# Patient Record
Sex: Female | Born: 1990 | Race: Black or African American | Hispanic: No | Marital: Single | Smoking: Never smoker
Health system: Southern US, Community
[De-identification: ages and names within clinical notes are randomized; demographics above are authoritative.]

---

## 2009-11-29 ENCOUNTER — Emergency Department (HOSPITAL_BASED_OUTPATIENT_CLINIC_OR_DEPARTMENT_OTHER): Admission: EM | Admit: 2009-11-29 | Discharge: 2009-11-29 | Payer: Self-pay | Admitting: Emergency Medicine

## 2010-07-28 ENCOUNTER — Emergency Department (HOSPITAL_BASED_OUTPATIENT_CLINIC_OR_DEPARTMENT_OTHER): Admission: EM | Admit: 2010-07-28 | Discharge: 2010-07-28 | Payer: Self-pay | Admitting: Emergency Medicine

## 2010-11-06 LAB — URINE MICROSCOPIC-ADD ON

## 2010-11-06 LAB — URINALYSIS, ROUTINE W REFLEX MICROSCOPIC
Glucose, UA: NEGATIVE mg/dL
Hgb urine dipstick: NEGATIVE
Protein, ur: NEGATIVE mg/dL
Specific Gravity, Urine: 1.016 (ref 1.005–1.030)
pH: 7.5 (ref 5.0–8.0)

## 2010-11-14 LAB — URINALYSIS, ROUTINE W REFLEX MICROSCOPIC
Bilirubin Urine: NEGATIVE
Hgb urine dipstick: NEGATIVE
Ketones, ur: 15 mg/dL — AB
Nitrite: NEGATIVE
Specific Gravity, Urine: 1.035 — ABNORMAL HIGH (ref 1.005–1.030)
Urobilinogen, UA: 2 mg/dL — ABNORMAL HIGH (ref 0.0–1.0)

## 2010-11-14 LAB — PREGNANCY, URINE: Preg Test, Ur: NEGATIVE

## 2011-01-08 ENCOUNTER — Emergency Department (HOSPITAL_BASED_OUTPATIENT_CLINIC_OR_DEPARTMENT_OTHER)
Admission: EM | Admit: 2011-01-08 | Discharge: 2011-01-08 | Disposition: A | Discharge status: Home / Self Care | Payer: Self-pay | Attending: Emergency Medicine | Admitting: Emergency Medicine

## 2011-01-08 DIAGNOSIS — R300 Dysuria: Secondary | ICD-10-CM | POA: Insufficient documentation

## 2011-01-08 DIAGNOSIS — N390 Urinary tract infection, site not specified: Secondary | ICD-10-CM | POA: Insufficient documentation

## 2011-01-08 LAB — URINALYSIS, ROUTINE W REFLEX MICROSCOPIC
Bilirubin Urine: NEGATIVE
Glucose, UA: NEGATIVE mg/dL
Hgb urine dipstick: NEGATIVE
Specific Gravity, Urine: 1.023 (ref 1.005–1.030)
Urobilinogen, UA: 1 mg/dL (ref 0.0–1.0)

## 2011-01-08 LAB — PREGNANCY, URINE: Preg Test, Ur: NEGATIVE

## 2011-08-25 ENCOUNTER — Emergency Department (HOSPITAL_BASED_OUTPATIENT_CLINIC_OR_DEPARTMENT_OTHER)
Admission: EM | Admit: 2011-08-25 | Discharge: 2011-08-25 | Disposition: A | Discharge status: Home / Self Care | Payer: Medicaid Other | Attending: Emergency Medicine | Admitting: Emergency Medicine

## 2011-08-25 ENCOUNTER — Encounter: Payer: Self-pay | Admitting: Emergency Medicine

## 2011-08-25 DIAGNOSIS — Z331 Pregnant state, incidental: Secondary | ICD-10-CM

## 2011-08-25 DIAGNOSIS — R350 Frequency of micturition: Secondary | ICD-10-CM | POA: Insufficient documentation

## 2011-08-25 DIAGNOSIS — O2690 Pregnancy related conditions, unspecified, unspecified trimester: Secondary | ICD-10-CM | POA: Insufficient documentation

## 2011-08-25 DIAGNOSIS — R109 Unspecified abdominal pain: Secondary | ICD-10-CM | POA: Insufficient documentation

## 2011-08-25 LAB — URINALYSIS, ROUTINE W REFLEX MICROSCOPIC
Leukocytes, UA: NEGATIVE
Nitrite: NEGATIVE
Specific Gravity, Urine: 1.017 (ref 1.005–1.030)
Urobilinogen, UA: 1 mg/dL (ref 0.0–1.0)
pH: 7 (ref 5.0–8.0)

## 2011-08-25 LAB — PREGNANCY, URINE: Preg Test, Ur: POSITIVE

## 2011-08-25 NOTE — ED Provider Notes (Signed)
History     CSN: [id number]  Arrival date & time 08/25/11  0906   First MD Initiated Contact with Patient 08/25/11 [phone]      Chief Complaint  Patient presents with  . Abdominal Pain    (Consider location/radiation/quality/duration/timing/severity/associated sxs/prior treatment) HPI Comments: Pt has noted a "fullness" in her pelvis for several days.  No vaginal pain, bleeding or d/c.  Menses are usually regular.  LMP ~ 07-11-2011.  Performed a home urine pregnancy test which was positive.  Patient is a 20 y.o. female presenting with abdominal pain. The history is provided by the patient. No language interpreter was used.  Abdominal Pain The primary symptoms of the illness do not include abdominal pain, fever, nausea, vomiting, diarrhea, hematochezia, dysuria, vaginal discharge or vaginal bleeding.  The patient states that she believes she is currently not pregnant. The patient has not had a change in bowel habit. Additional symptoms associated with the illness include frequency. Symptoms associated with the illness do not include urgency, hematuria or back pain.    History reviewed. No pertinent past medical history.  History reviewed. No pertinent past surgical history.  History reviewed. No pertinent family history.  History  Substance Use Topics  . Smoking status: Never Smoker   . Smokeless tobacco: Not on file  . Alcohol Use: Yes     occasional    OB History    Grav Para Term Preterm Abortions TAB SAB Ect Mult Living                  Review of Systems  Constitutional: Negative for fever.  Gastrointestinal: Negative for nausea, vomiting, abdominal pain, diarrhea and hematochezia.  Genitourinary: Positive for frequency. Negative for dysuria, urgency, hematuria, vaginal bleeding and vaginal discharge.  Musculoskeletal: Negative for back pain.  All other systems reviewed and are negative.    Allergies  Review of patient's allergies indicates no known  allergies.  Home Medications  No current outpatient prescriptions on file.  BP 125/62  Pulse 102  Temp(Src) 98.4 F (36.9 C) (Oral)  Resp 20  Ht 5\' 4"  (1.626 m)  Wt 248 lb 14.4 oz (112.9 kg)  BMI 42.72 kg/m2  SpO2 100%  LMP 07/11/2011  Physical Exam  Nursing note and vitals reviewed. Constitutional: She is oriented to person, place, and time. She appears well-developed and well-nourished. No distress.  HENT:  Head: Normocephalic and atraumatic.  Eyes: EOM are normal.  Neck: Normal range of motion.  Cardiovascular: Normal rate, regular rhythm and normal heart sounds.   Pulmonary/Chest: Effort normal and breath sounds normal.  Abdominal: Soft. Normal appearance. She exhibits no distension. There is no tenderness. There is no rigidity, no rebound, no guarding, no tenderness at McBurney's point and negative Murphy's sign.    Musculoskeletal: Normal range of motion.  Neurological: She is alert and oriented to person, place, and time.  Skin: Skin is warm and dry.  Psychiatric: She has a normal mood and affect. Judgment normal.    ED Course  Procedures (including critical care time)   Labs Reviewed  PREGNANCY, URINE  URINALYSIS, ROUTINE W REFLEX MICROSCOPIC   No results found.   No diagnosis found.    MDM          Worthy Rancher, PA 08/25/11 1005

## 2011-08-25 NOTE — ED Notes (Signed)
Lower abdominal pain x 2-3 weeks.  No N/V/D.  No vaginal discharge.  No fevers.  Some increase in urinary frequency but no burning or urgency.

## 2011-08-26 NOTE — ED Provider Notes (Signed)
Medical screening examination/treatment/procedure(s) were performed by non-physician practitioner and as supervising physician I was immediately available for consultation/collaboration.   Andree Golphin A. Misha Antonini, MD 08/26/11 1452 

## 2014-04-18 ENCOUNTER — Emergency Department (HOSPITAL_BASED_OUTPATIENT_CLINIC_OR_DEPARTMENT_OTHER)
Admission: EM | Admit: 2014-04-18 | Discharge: 2014-04-18 | Disposition: A | Discharge status: Home / Self Care | Payer: Medicaid Other | Attending: Emergency Medicine | Admitting: Emergency Medicine

## 2014-04-18 ENCOUNTER — Encounter (HOSPITAL_BASED_OUTPATIENT_CLINIC_OR_DEPARTMENT_OTHER): Payer: Self-pay | Admitting: Emergency Medicine

## 2014-04-18 DIAGNOSIS — J358 Other chronic diseases of tonsils and adenoids: Secondary | ICD-10-CM | POA: Diagnosis not present

## 2014-04-18 DIAGNOSIS — J039 Acute tonsillitis, unspecified: Secondary | ICD-10-CM | POA: Diagnosis not present

## 2014-04-18 DIAGNOSIS — J029 Acute pharyngitis, unspecified: Secondary | ICD-10-CM | POA: Diagnosis present

## 2014-04-18 LAB — RAPID STREP SCREEN (MED CTR MEBANE ONLY): Streptococcus, Group A Screen (Direct): NEGATIVE

## 2014-04-18 MED ORDER — PENICILLIN V POTASSIUM 500 MG PO TABS: 500.0000 mg | ORAL_TABLET | Freq: Four times a day (QID) | ORAL | Status: AC

## 2014-04-18 NOTE — Discharge Instructions (Signed)

## 2014-04-18 NOTE — ED Provider Notes (Signed)
History/physical exam/procedure(s) were performed by non-physician practitioner and as supervising physician I was immediately available for consultation/collaboration. I have reviewed all notes and am in agreement with care and plan.   Chalyn Amescua S Akaylah Lalley, MD 04/18/14 2038 

## 2014-04-18 NOTE — ED Notes (Signed)
Sore throat x 3 days

## 2014-04-18 NOTE — ED Provider Notes (Signed)
CSN: [id number]     Arrival date & time 04/18/14  1759 History   First MD Initiated Contact with Patient 04/18/14 1807     Chief Complaint  Patient presents with  . Sore Throat     (Consider location/radiation/quality/duration/timing/severity/associated sxs/prior Treatment) Patient is a 23 y.o. female presenting with pharyngitis. The history is provided by the patient. No language interpreter was used.  Sore Throat This is a new problem. Episode onset: 3 days. The problem occurs constantly. The problem has been gradually worsening. Associated symptoms include a sore throat. Nothing aggravates the symptoms. She has tried nothing for the symptoms. The treatment provided no relief.  Pt complains of a sore throat.  Pt reports multiple hard white areas to throat  History reviewed. No pertinent past medical history. History reviewed. No pertinent past surgical history. No family history on file. History  Substance Use Topics  . Smoking status: Never Smoker   . Smokeless tobacco: Not on file  . Alcohol Use: Yes     Comment: occasional   OB History   Grav Para Term Preterm Abortions TAB SAB Ect Mult Living                 Review of Systems  HENT: Positive for sore throat.   All other systems reviewed and are negative.     Allergies  Review of patient's allergies indicates no known allergies.  Home Medications   Prior to Admission medications   Not on File   BP 130/90  Pulse 111  Temp(Src) 98.1 F (36.7 C) (Oral)  Resp 20  Ht  (1.626 m)  Wt 280 lb (127.007 kg)  BMI 48.04 kg/m2  SpO2 100% Physical Exam  Nursing note and vitals reviewed. Constitutional: She is oriented to person, place, and time. She appears well-developed and well-nourished.  HENT:  Head: Normocephalic.  Mouth/Throat: Oropharyngeal exudate present.  Swollen tonsils,  Exudative areas,  Tonsilliths,   Eyes: EOM are normal.  Neck: Normal range of motion.  Cardiovascular: Normal rate and regular  rhythm.   Pulmonary/Chest: Effort normal.  Abdominal: She exhibits no distension.  Musculoskeletal: Normal range of motion.  Neurological: She is alert and oriented to person, place, and time.  Skin: Skin is warm.  Psychiatric: She has a normal mood and affect.    ED Course  Procedures (including critical care time) Labs Review Labs Reviewed  RAPID STREP SCREEN    Imaging Review No results found.   EKG Interpretation None      MDM   Final diagnoses:  Tonsillith  Tonsillitis    Pt referred to ENT for evaluation of chronic tonsil issues.   I will treat acutely with PCn.      Lonia Skinner [facility], PA-C 04/18/14 [phone]

## 2014-04-18 NOTE — ED Notes (Signed)
PA at bedside.

## 2014-04-20 LAB — CULTURE, GROUP A STREP

## 2014-06-03 ENCOUNTER — Emergency Department (HOSPITAL_BASED_OUTPATIENT_CLINIC_OR_DEPARTMENT_OTHER)
Admission: EM | Admit: 2014-06-03 | Discharge: 2014-06-04 | Disposition: A | Discharge status: Home / Self Care | Payer: Medicaid Other | Attending: Emergency Medicine | Admitting: Emergency Medicine

## 2014-06-03 ENCOUNTER — Encounter (HOSPITAL_BASED_OUTPATIENT_CLINIC_OR_DEPARTMENT_OTHER): Payer: Self-pay | Admitting: Emergency Medicine

## 2014-06-03 DIAGNOSIS — N898 Other specified noninflammatory disorders of vagina: Secondary | ICD-10-CM | POA: Insufficient documentation

## 2014-06-03 DIAGNOSIS — Z3202 Encounter for pregnancy test, result negative: Secondary | ICD-10-CM | POA: Insufficient documentation

## 2014-06-03 DIAGNOSIS — R1031 Right lower quadrant pain: Secondary | ICD-10-CM | POA: Insufficient documentation

## 2014-06-03 DIAGNOSIS — R1032 Left lower quadrant pain: Secondary | ICD-10-CM | POA: Insufficient documentation

## 2014-06-03 DIAGNOSIS — R109 Unspecified abdominal pain: Secondary | ICD-10-CM

## 2014-06-03 LAB — URINALYSIS, ROUTINE W REFLEX MICROSCOPIC
Bilirubin Urine: NEGATIVE
Glucose, UA: NEGATIVE mg/dL
Hgb urine dipstick: NEGATIVE
Ketones, ur: NEGATIVE mg/dL
NITRITE: NEGATIVE
Protein, ur: NEGATIVE mg/dL
SPECIFIC GRAVITY, URINE: 1.006 (ref 1.005–1.030)
UROBILINOGEN UA: 0.2 mg/dL (ref 0.0–1.0)
pH: 6.5 (ref 5.0–8.0)

## 2014-06-03 LAB — PREGNANCY, URINE: PREG TEST UR: NEGATIVE

## 2014-06-03 LAB — URINE MICROSCOPIC-ADD ON

## 2014-06-03 NOTE — ED Notes (Signed)
Abdominal pain since yesterday. Feels like menstrual cramps.

## 2014-06-04 LAB — WET PREP, GENITAL
CLUE CELLS WET PREP: NONE SEEN
TRICH WET PREP: NONE SEEN
YEAST WET PREP: NONE SEEN

## 2014-06-04 MED ORDER — IBUPROFEN 600 MG PO TABS: 600.0000 mg | ORAL_TABLET | Freq: Four times a day (QID) | ORAL | Status: DC | PRN

## 2014-06-04 NOTE — ED Provider Notes (Signed)
CSN: [id number]     Arrival date & time 06/03/14  2311 History   First MD Initiated Contact with Patient 06/04/14 0006     Chief Complaint  Patient presents with  . Abdominal Pain     (Consider location/radiation/quality/duration/timing/severity/associated sxs/prior Treatment) Patient is a 23 y.o. female presenting with abdominal pain. The history is provided by the patient.  Abdominal Pain Pain location:  RLQ and LLQ Pain quality: cramping   Pain radiates to:  Does not radiate Pain severity:  Moderate Duration:  2 days Timing:  Intermittent Progression:  Unchanged Chronicity:  New Ineffective treatments:  None tried Associated symptoms: vaginal discharge   Associated symptoms: no anorexia, no chest pain, no constipation, no diarrhea, no dysuria, no hematuria, no nausea, no shortness of breath, no vaginal bleeding and no vomiting     History reviewed. No pertinent past medical history. History reviewed. No pertinent past surgical history. No family history on file. History  Substance Use Topics  . Smoking status: Never Smoker   . Smokeless tobacco: Not on file  . Alcohol Use: Yes     Comment: occasional   OB History   Grav Para Term Preterm Abortions TAB SAB Ect Mult Living                 Review of Systems  Constitutional: Negative for activity change.  Respiratory: Negative for shortness of breath.   Cardiovascular: Negative for chest pain.  Gastrointestinal: Positive for abdominal pain. Negative for nausea, vomiting, diarrhea, constipation and anorexia.  Genitourinary: Positive for vaginal discharge. Negative for dysuria, hematuria and vaginal bleeding.  Musculoskeletal: Negative for neck pain.  Neurological: Negative for headaches.      Allergies  Review of patient's allergies indicates no known allergies.  Home Medications   Prior to Admission medications   Medication Sig Start Date End Date Taking? Authorizing Provider  ibuprofen (ADVIL,MOTRIN) 600 MG  tablet Take 1 tablet (600 mg total) by mouth every 6 (six) hours as needed. 06/04/14   Finnley [place]so Rhunette CroftNanavati, MD   BP 119/51  Pulse 86  Temp(Src) 98.2 F (36.8 C) (Oral)  Resp 18  Ht 5\' 4"  (1.626 m)  Wt 280 lb (127.007 kg)  BMI 48.04 kg/m2  SpO2 98%  LMP 05/15/2014 Physical Exam  Nursing note and vitals reviewed. Constitutional: She is oriented to person, place, and time. She appears well-developed.  HENT:  Head: Normocephalic and atraumatic.  Eyes: Conjunctivae and EOM are normal. Pupils are equal, round, and reactive to light.  Neck: Normal range of motion. Neck supple.  Cardiovascular: Normal rate, regular rhythm, normal heart sounds and intact distal pulses.   No murmur heard. Pulmonary/Chest: Effort normal. No respiratory distress. She has no wheezes.  Abdominal: Soft. Bowel sounds are normal. She exhibits no distension. There is tenderness. There is no rebound and no guarding.  Lower quadrant tenderness  Genitourinary: Vagina normal and uterus normal.  External exam - normal, no lesions Speculum exam: Pt has some white discharge, no blood Bimanual exam: Patient has no CMT, no adnexal tenderness or fullness and cervical os is closed  Neurological: She is alert and oriented to person, place, and time.  Skin: Skin is warm and dry.    ED Course  Procedures (including critical care time) Labs Review Labs Reviewed  WET PREP, GENITAL - Abnormal; Notable for the following:    WBC, Wet Prep HPF POC FEW (*)    All other components within normal limits  URINALYSIS, ROUTINE W REFLEX MICROSCOPIC - Abnormal; Notable  for the following:    Leukocytes, UA TRACE (*)    All other components within normal limits  URINE MICROSCOPIC-ADD ON - Abnormal; Notable for the following:    Bacteria, UA FEW (*)    All other components within normal limits  GC/CHLAMYDIA PROBE AMP  PREGNANCY, URINE    Imaging Review No results found.   EKG Interpretation None      MDM   Final diagnoses:   Abdominal pain in female    Pt comes in with cc of abd pain. Pain in the lower quadrants, intermittent, crampy. Pt not on period. Upreg neg. Labs are WNL. GI and GU exam non surgical. No indication for CT imaging. don't suspect any emergent condition causing the intermittent, moderate pain in light of normal exam and vitals. Gyne f/u requested.  Derwood KaplanAnkit Miachel Nardelli, MD 06/04/14 [phone]

## 2014-06-04 NOTE — Discharge Instructions (Signed)
Abdominal Pain, Women °Abdominal (stomach, pelvic, or belly) pain can be caused by many things. It is important to tell your doctor: °· The location of the pain. °· Does it come and go or is it present all the time? °· Are there things that start the pain (eating certain foods, exercise)? °· Are there other symptoms associated with the pain (fever, nausea, vomiting, diarrhea)? °All of this is helpful to know when trying to find the cause of the pain. °CAUSES  °· Stomach: virus or bacteria infection, or ulcer. °· Intestine: appendicitis (inflamed appendix), regional ileitis (Crohn's disease), ulcerative colitis (inflamed colon), irritable bowel syndrome, diverticulitis (inflamed diverticulum of the colon), or cancer of the stomach or intestine. °· Gallbladder disease or stones in the gallbladder. °· Kidney disease, kidney stones, or infection. °· Pancreas infection or cancer. °· Fibromyalgia (pain disorder). °· Diseases of the female organs: °¨ Uterus: fibroid (non-cancerous) tumors or infection. °¨ Fallopian tubes: infection or tubal pregnancy. °¨ Ovary: cysts or tumors. °¨ Pelvic adhesions (scar tissue). °¨ Endometriosis (uterus lining tissue growing in the pelvis and on the pelvic organs). °¨ Pelvic congestion syndrome (female organs filling up with blood just before the menstrual period). °¨ Pain with the menstrual period. °¨ Pain with ovulation (producing an egg). °¨ Pain with an IUD (intrauterine device, birth control) in the uterus. °¨ Cancer of the female organs. °· Functional pain (pain not caused by a disease, may improve without treatment). °· Psychological pain. °· Depression. °DIAGNOSIS  °Your doctor will decide the seriousness of your pain by doing an examination. °· Blood tests. °· X-rays. °· Ultrasound. °· CT scan (computed tomography, special type of X-ray). °· MRI (magnetic resonance imaging). °· Cultures, for infection. °· Barium enema (dye inserted in the large intestine, to better view it with  X-rays). °· Colonoscopy (looking in intestine with a lighted tube). °· Laparoscopy (minor surgery, looking in abdomen with a lighted tube). °· Major abdominal exploratory surgery (looking in abdomen with a large incision). °TREATMENT  °The treatment will depend on the cause of the pain.  °· Many cases can be observed and treated at home. °· Over-the-counter medicines recommended by your caregiver. °· Prescription medicine. °· Antibiotics, for infection. °· Birth control pills, for painful periods or for ovulation pain. °· Hormone treatment, for endometriosis. °· Nerve blocking injections. °· Physical therapy. °· Antidepressants. °· Counseling with a psychologist or psychiatrist. °· Minor or major surgery. °HOME CARE INSTRUCTIONS  °· Do not take laxatives, unless directed by your caregiver. °· Take over-the-counter pain medicine only if ordered by your caregiver. Do not take aspirin because it can cause an upset stomach or bleeding. °· Try a clear liquid diet (broth or water) as ordered by your caregiver. Slowly move to a bland diet, as tolerated, if the pain is related to the stomach or intestine. °· Have a thermometer and take your temperature several times a day, and record it. °· Bed rest and sleep, if it helps the pain. °· Avoid sexual intercourse, if it causes pain. °· Avoid stressful situations. °· Keep your follow-up appointments and tests, as your caregiver orders. °· If the pain does not go away with medicine or surgery, you may try: °¨ Acupuncture. °¨ Relaxation exercises (yoga, meditation). °¨ Group therapy. °¨ Counseling. °SEEK MEDICAL CARE IF:  °· You notice certain foods cause stomach pain. °· Your home care treatment is not helping your pain. °· You need stronger pain medicine. °· You want your IUD removed. °· You feel faint or   lightheaded. °· You develop nausea and vomiting. °· You develop a rash. °· You are having side effects or an allergy to your medicine. °SEEK IMMEDIATE MEDICAL CARE IF:  °· Your  pain does not go away or gets worse. °· You have a fever. °· Your pain is felt only in portions of the abdomen. The right side could possibly be appendicitis. The left lower portion of the abdomen could be colitis or diverticulitis. °· You are passing blood in your stools (bright red or black tarry stools, with or without vomiting). °· You have blood in your urine. °· You develop chills, with or without a fever. °· You pass out. °MAKE SURE YOU:  °· Understand these instructions. °· Will watch your condition. °· Will get help right away if you are not doing well or get worse. °Document Released: 06/09/2007 Document Revised: 12/27/2013 Document Reviewed: 06/29/2009 °ExitCare® Patient Information ©2015 [employer]. This information is not intended to replace advice given to you by your health care provider. Make sure you discuss any questions you have with your health care provider. °Pelvic Pain °Pelvic pain is pain felt below the belly button and between your hips. It can be caused by many different things. It is important to get help right away. This is especially true for severe, sharp, or unusual pain that comes on suddenly.  °HOME CARE °· Only take medicine as told by your doctor. °· Rest as told by your doctor. °· Eat a healthy diet, such as fruits, vegetables, and lean meats. °· Drink enough fluids to keep your pee (urine) clear or pale yellow, or as told. °· Avoid sex (intercourse) if it causes pain. °· Apply warm or cold packs to your lower belly (abdomen). Use the type of pack that helps the pain. °· Avoid situations that cause you stress. °· Keep a journal to track your pain. Write down: °¨ When the pain started. °¨ Where it is located. °¨ If there are things that seem to be related to the pain, such as food or your period. °· Follow up with your doctor as told. °GET HELP RIGHT AWAY IF:  °· You have heavy bleeding from the vagina. °· You have more pelvic pain. °· You feel lightheaded or pass out  (faint). °· You have chills. °· You have pain when you pee or have blood in your pee. °· You cannot stop having watery poop (diarrhea). °· You cannot stop throwing up (vomiting). °· You have a fever or lasting symptoms for more than 3 days. °· You have a fever and your symptoms suddenly get worse. °· You are being physically or sexually abused. °· Your medicine does not help your pain. °· You have fluid (discharge) coming from your vagina that is not normal. °MAKE SURE YOU: °· Understand these instructions. °· Will watch your condition. °· Will get help if you are not doing well or get worse. °Document Released: 01/29/2008 Document Revised: 02/11/2012 Document Reviewed: 12/02/2011 °ExitCare® Patient Information ©2015 [employer]. This information is not intended to replace advice given to you by your health care provider. Make sure you discuss any questions you have with your health care provider. ° °

## 2014-06-06 LAB — GC/CHLAMYDIA PROBE AMP
CT Probe RNA: NEGATIVE
GC Probe RNA: NEGATIVE

## 2014-06-07 ENCOUNTER — Telehealth (HOSPITAL_COMMUNITY): Payer: Self-pay

## 2015-05-07 ENCOUNTER — Emergency Department (HOSPITAL_BASED_OUTPATIENT_CLINIC_OR_DEPARTMENT_OTHER)
Admission: EM | Admit: 2015-05-07 | Discharge: 2015-05-07 | Disposition: A | Discharge status: Home / Self Care | Payer: Medicaid Other | Attending: Emergency Medicine | Admitting: Emergency Medicine

## 2015-05-07 ENCOUNTER — Encounter (HOSPITAL_BASED_OUTPATIENT_CLINIC_OR_DEPARTMENT_OTHER): Payer: Self-pay

## 2015-05-07 DIAGNOSIS — O99612 Diseases of the digestive system complicating pregnancy, second trimester: Secondary | ICD-10-CM | POA: Insufficient documentation

## 2015-05-07 DIAGNOSIS — Z3A26 26 weeks gestation of pregnancy: Secondary | ICD-10-CM | POA: Insufficient documentation

## 2015-05-07 DIAGNOSIS — R197 Diarrhea, unspecified: Secondary | ICD-10-CM | POA: Diagnosis not present

## 2015-05-07 DIAGNOSIS — K115 Sialolithiasis: Secondary | ICD-10-CM

## 2015-05-07 DIAGNOSIS — O9989 Other specified diseases and conditions complicating pregnancy, childbirth and the puerperium: Secondary | ICD-10-CM | POA: Diagnosis present

## 2015-05-07 MED ORDER — HYDROCODONE-ACETAMINOPHEN 5-325 MG PO TABS: 1.0000 | ORAL_TABLET | Freq: Four times a day (QID) | ORAL | Status: DC | PRN

## 2015-05-07 MED ORDER — CEPHALEXIN 500 MG PO CAPS: 500.0000 mg | ORAL_CAPSULE | Freq: Four times a day (QID) | ORAL | Status: DC

## 2015-05-07 MED ORDER — CEPHALEXIN 250 MG PO CAPS
500.0000 mg | ORAL_CAPSULE | Freq: Once | ORAL | Status: AC
  Administered 2015-05-07: 500 mg via ORAL
  Filled 2015-05-07: qty 2

## 2015-05-07 NOTE — ED Notes (Addendum)
Swelling noted to left jaw that started today.  26 weeks preg.  Denies difficulty breathing or swallowing.

## 2015-05-07 NOTE — ED Notes (Addendum)
Dr. Deretha Emory at [facility]. Pt alert, NAD, calm, interactive, c/o L neck swelling and tenderness (noted), mentions occaisional heartburn,  (denies: dysphagia, dysarthria, cough, congestion, sob, ear issues, nvd, fever, sob, dizziness), pt [redacted] wks pregnant, last OB visit 4 weeks ago, pt of [facility] Dr. Allena Katz. orophaynx unremarkable. States, "feeling baby move all the time".

## 2015-05-07 NOTE — ED Provider Notes (Signed)
CSN: [id number]     Arrival date & time 05/07/15  1817 History  This chart was scribed for Vanetta Mulders, MD by Budd Palmer, ED Scribe. This patient was seen in room MH05/MH05 and the patient's care was started at 7:38 PM.     Chief Complaint  Patient presents with  . Lymphadenopathy   The history is provided by the patient. No language interpreter was used.   HPI Comments: Carrie Koch is a 24 y.o. female who is [redacted] weeks pregnant, who presents to the Emergency Department complaining of lymphadenopathy onset 1 PM today. She states the swelling began under her tongue, and then spread to under the left jaw. She reports associated pain to the area, and notes it feels as though she has a "swollen vein" under her tongue. She states she is seeing an OBGYN in [place] and is on prenatal vitamins. She will be following up with them in 5 days. She notes she has felt fetal movement on several occasions today. Pt denies fever or dental pain.  History reviewed. No pertinent past medical history. History reviewed. No pertinent past surgical history. No family history on file. Social History  Substance Use Topics  . Smoking status: Never Smoker   . Smokeless tobacco: None  . Alcohol Use: No     Comment: occasional   OB History    Gravida Para Term Preterm AB TAB SAB Ectopic Multiple Living   1              Review of Systems  Constitutional: Negative for fever.  HENT: Negative for dental problem, rhinorrhea, sore throat and trouble swallowing.   Eyes: Negative for visual disturbance.  Respiratory: Negative for cough and shortness of breath.   Cardiovascular: Negative for chest pain and leg swelling.  Gastrointestinal: Positive for diarrhea. Negative for nausea, vomiting and abdominal pain.  Genitourinary: Negative for dysuria and vaginal bleeding.  Skin: Negative for rash.  Neurological: Negative for headaches.  Hematological: Does not bruise/bleed easily.    Allergies  Review of  patient's allergies indicates no known allergies.  Home Medications   Prior to Admission medications   Medication Sig Start Date End Date Taking? Authorizing Provider  Prenatal Vit-Fe Fumarate-FA (PRENATAL MULTIVITAMIN) TABS tablet Take 1 tablet by mouth daily at 12 noon.   Yes Historical Provider, MD  cephALEXin (KEFLEX) 500 MG capsule Take 1 capsule (500 mg total) by mouth 4 (four) times daily. 05/07/15   Vanetta Mulders, MD  HYDROcodone-acetaminophen (NORCO/VICODIN) 5-325 MG per tablet Take 1-2 tablets by mouth every 6 (six) hours as needed for moderate pain. 05/07/15   Vanetta Mulders, MD  ibuprofen (ADVIL,MOTRIN) 600 MG tablet Take 1 tablet (600 mg total) by mouth every 6 (six) hours as needed. 06/04/14   Ankit Rhunette Croft, MD   BP 114/69 mmHg  Pulse 92  Temp(Src) 98.4 F (36.9 C) (Oral)  Resp 18  SpO2 99%  LMP 05/15/2014 Physical Exam  Constitutional: She is oriented to person, place, and time. She appears well-developed and well-nourished.  HENT:  Head: Normocephalic and atraumatic.  Mouth/Throat: Oropharynx is clear and moist.  Uvula is midline. Submandibular gland is significantly swollen at 3 cm in diameter, and fairly hard without erythema.  Eyes: Conjunctivae and EOM are normal. Pupils are equal, round, and reactive to light. Right eye exhibits no discharge. Left eye exhibits no discharge. No scleral icterus.  Cardiovascular: Normal rate, regular rhythm and normal heart sounds.   Pulmonary/Chest: Effort normal and breath sounds normal. No respiratory  distress.  Abdominal: Bowel sounds are normal. There is no tenderness.  Musculoskeletal: She exhibits no edema.  No ankle swelling  Neurological: She is alert and oriented to person, place, and time. No cranial nerve deficit. She exhibits normal muscle tone. Coordination normal.  Skin: Skin is warm and dry. No rash noted. She is not diaphoretic. No erythema.  Psychiatric: She has a normal mood and affect.  Nursing note and vitals  reviewed.   ED Course  Procedures  DIAGNOSTIC STUDIES: Oxygen Saturation is 98% on RA, normal by my interpretation.    COORDINATION OF CARE: 7:47 PM - Discussed probable stone in the salivary duct. Discussed plans to stimulate salivary gland to pass the stone. Pt advised of plan for treatment and pt agrees.  Labs Review Labs Reviewed - No data to display  Imaging Review No results found. I have personally reviewed and evaluated these images and lab results as part of my medical decision-making.   EKG Interpretation None      MDM   Final diagnoses:  Sialolithiasis of submandibular gland    Acute swelling of the left submandibular salivary gland. Most likely due to a stone. Not able to palpate a stone. A whitish discharge but not consistent with pus. Will treat which is hydrocodone and not anti-inflammatory since she's [redacted] weeks pregnant. Also will treat with the antibody Keflex to prevent any secondary infection. Referral to ear nose and throat provided. Patient has follow-up with OB/GYN later this week. Patient without any complicating factors related to her pregnancy at this time.   I personally performed the services described in this documentation, which was scribed in my presence. The recorded information has been reviewed and is accurate.    Vanetta Mulders, MD 05/07/15 2059

## 2015-05-07 NOTE — Discharge Instructions (Signed)
Salivary Stone Your exam shows you have a stone in one of your saliva glands. These small stones form around a mucous plug in the ducts of the glands and cause the saliva in the gland to be blocked. This makes the gland swollen and painful, especially when you eat. If repeated episodes occur, the gland can become infected. Sometimes these stones can be seen on x-ray. Treatment includes stimulating the production of saliva to push the stone out. You should suck on a lemon or sour candies several times daily. Antibiotic medicine may be needed if the gland is infected. Increasing fluids, applying warm compresses to the swollen area 3-4 times daily, and massaging the gland from back to front may encourage drainage and passage of the stone. Surgical treatment to remove the stone is sometimes necessary, so proper medical follow up is very important. Call your doctor for an appointment as recommended. Call right away if you have a high fever, severe headache, vomiting, uncontrolled pain, or other serious symptoms. Document Released: 09/19/2004 Document Revised: 11/04/2011 Document Reviewed: 08/12/2005 [facility] Patient Information 2015 [place], [place]. This information is not intended to replace advice given to you by your health care provider. Make sure you discuss any questions you have with your health care provider.  Continue use the antibody as directed. Take the pain medicine as needed. Continue to massage the gland but do not squeeze it. Continue to use of sour tasting lemon drops or either lemon to help stimulate the gland to get the stone out.

## 2015-11-09 ENCOUNTER — Emergency Department (HOSPITAL_BASED_OUTPATIENT_CLINIC_OR_DEPARTMENT_OTHER)
Admission: EM | Admit: 2015-11-09 | Discharge: 2015-11-09 | Disposition: A | Discharge status: Home / Self Care | Payer: Medicaid Other | Attending: Physician Assistant | Admitting: Physician Assistant

## 2015-11-09 ENCOUNTER — Emergency Department (HOSPITAL_BASED_OUTPATIENT_CLINIC_OR_DEPARTMENT_OTHER): Payer: Medicaid Other

## 2015-11-09 ENCOUNTER — Encounter (HOSPITAL_BASED_OUTPATIENT_CLINIC_OR_DEPARTMENT_OTHER): Payer: Self-pay | Admitting: *Deleted

## 2015-11-09 DIAGNOSIS — B349 Viral infection, unspecified: Secondary | ICD-10-CM

## 2015-11-09 DIAGNOSIS — R000 Tachycardia, unspecified: Secondary | ICD-10-CM | POA: Diagnosis not present

## 2015-11-09 DIAGNOSIS — J029 Acute pharyngitis, unspecified: Secondary | ICD-10-CM | POA: Diagnosis present

## 2015-11-09 DIAGNOSIS — Z79899 Other long term (current) drug therapy: Secondary | ICD-10-CM | POA: Insufficient documentation

## 2015-11-09 LAB — RAPID STREP SCREEN (MED CTR MEBANE ONLY): Streptococcus, Group A Screen (Direct): NEGATIVE

## 2015-11-09 MED ORDER — GUAIFENESIN 100 MG/5ML PO LIQD: 100.0000 mg | ORAL | Status: DC | PRN

## 2015-11-09 MED ORDER — IBUPROFEN 800 MG PO TABS: 800.0000 mg | ORAL_TABLET | Freq: Three times a day (TID) | ORAL | Status: DC

## 2015-11-09 MED ORDER — ACETAMINOPHEN 325 MG PO TABS
650.0000 mg | ORAL_TABLET | Freq: Once | ORAL | Status: AC
  Administered 2015-11-09: 650 mg via ORAL
  Filled 2015-11-09: qty 2

## 2015-11-09 MED FILL — ROBAFEN 100 MG/5 ML SYRUP: 100 | 2 days supply | Qty: 118 | Fill #0

## 2015-11-09 MED FILL — IBUPROFEN 800 MG TABLET: 800 | 7 days supply | Qty: 21 | Fill #0

## 2015-11-09 NOTE — ED Provider Notes (Signed)
CSN: [id number]     Arrival date & time 11/09/15  1013 History   First MD Initiated Contact with Patient 11/09/15 1023     Chief Complaint  Patient presents with  . Sore Throat     (Consider location/radiation/quality/duration/timing/severity/associated sxs/prior Treatment) HPI   Patient a very pleasant 25 year old female percent in a couple days of fever, myalgia, sore throat, cough. Patient's had no nausea vomiting or diarrhea. Patient has a 10-year-old home as well as a 58-month-old. Patient is currently nursing.  Patient has noted that the symptoms have been getting worse over the last 2 days.   Patient has cough. She has been taking over-the-counter remedies which have been helping.  History reviewed. No pertinent past medical history. History reviewed. No pertinent past surgical history. History reviewed. No pertinent family history. Social History  Substance Use Topics  . Smoking status: Never Smoker   . Smokeless tobacco: None  . Alcohol Use: No     Comment: occasional   OB History    Gravida Para Term Preterm AB TAB SAB Ectopic Multiple Living   1              Review of Systems  Constitutional: Positive for fever and fatigue. Negative for activity change.  HENT: Positive for congestion.   Respiratory: Positive for cough. Negative for shortness of breath.   Cardiovascular: Negative for chest pain.  Gastrointestinal: Negative for abdominal pain.  Musculoskeletal: Negative for back pain.  Neurological: Positive for headaches. Negative for weakness.      Allergies  Review of patient's allergies indicates no known allergies.  Home Medications   Prior to Admission medications   Medication Sig Start Date End Date Taking? Authorizing Provider  guaiFENesin (ROBITUSSIN) 100 MG/5ML liquid Take 5-10 mLs (100-200 mg total) by mouth every 4 (four) hours as needed for cough. 11/09/15   Alando Colleran Lyn Miho Monda, MD  ibuprofen (ADVIL,MOTRIN) 800 MG tablet Take 1 tablet (800 mg  total) by mouth 3 (three) times daily. 11/09/15   Racquelle Hyser Lyn Mancil Pfenning, MD  Prenatal Vit-Fe Fumarate-FA (PRENATAL MULTIVITAMIN) TABS tablet Take 1 tablet by mouth daily at 12 noon.    Historical Provider, MD   BP 122/68 mmHg  Pulse 88  Temp(Src) 98.7 F (37.1 C) (Oral)  Resp 22  Ht  (1.626 m)  Wt 275 lb (124.739 kg)  BMI 47.18 kg/m2  SpO2 100%  Breastfeeding? Yes Physical Exam  Constitutional: She is oriented to person, place, and time. She appears well-developed and well-nourished.  HENT:  Head: Normocephalic and atraumatic.  Mild erythema to posterior pharynx.  Eyes: Conjunctivae are normal. Right eye exhibits no discharge.  Neck: Neck supple.  Cardiovascular: Regular rhythm and normal heart sounds.   No murmur heard. Tachycardia  Pulmonary/Chest: Effort normal and breath sounds normal. She has no wheezes. She has no rales.  Abdominal: Soft. She exhibits no distension. There is no tenderness.  Musculoskeletal: Normal range of motion. She exhibits no edema.  Neurological: She is oriented to person, place, and time. No cranial nerve deficit.  Skin: Skin is warm and dry. No rash noted. She is not diaphoretic.  Psychiatric: She has a normal mood and affect. Her behavior is normal.  Nursing note and vitals reviewed.   ED Course  Procedures (including critical care time) Labs Review Labs Reviewed  RAPID STREP SCREEN (NOT AT [facility])  CULTURE, GROUP A STREP [facility])    Imaging Review Dg Chest 2 View  11/09/2015  CLINICAL DATA:  Cough and fever for 4  days EXAM: CHEST  2 VIEW COMPARISON:  None. FINDINGS: The heart size and mediastinal contours are within normal limits. Both lungs are clear. The visualized skeletal structures are unremarkable. IMPRESSION: No active cardiopulmonary disease. Electronically Signed   By: Alcide CleverMark  Lukens M.D.   On: 11/09/2015 11:09   I have personally reviewed and evaluated these images and lab results as part of my medical decision-making.   EKG  Interpretation None      MDM   Final diagnoses:  Viral syndrome    Patient's very pleasant 25 year old female presenting with a couple days of viral-like illness. Patient reports she's been controlling her symptoms at home with over-the-counter medications. Will order strep test as well as chest x-ray. Anticipate that they will be negative we'll treat as viral illness. Patient lactating so we'll be conservative on the medications that we give to make sure the are safe for breast milk. Robitussin  seems to have a good safety profile and thus we'll prescribe this.  Strep neg, cxr normal. Will discharge with robitussin, ibuprofen and follow up with PCP.  Patient had normal vitals, and appeared nontoxic at time of discharg.    Magdalyn Arenivas Randall AnLyn Travonte Byard, MD 11/09/15 1353

## 2015-11-09 NOTE — Discharge Instructions (Signed)
Be sure to drink plenty of fluids. Use that occasion provided to help with cough. It is reportedly safe in breast-feeding. Follow up with your primary care physician.   Viral Infections A viral infection can be caused by different types of viruses.Most viral infections are not serious and resolve on their own. However, some infections may cause severe symptoms and may lead to further complications. SYMPTOMS Viruses can frequently cause:  Minor sore throat.  Aches and pains.  Headaches.  Runny nose.  Different types of rashes.  Watery eyes.  Tiredness.  Cough.  Loss of appetite.  Gastrointestinal infections, resulting in nausea, vomiting, and diarrhea. These symptoms do not respond to antibiotics because the infection is not caused by bacteria. However, you might catch a bacterial infection following the viral infection. This is sometimes called a "superinfection." Symptoms of such a bacterial infection may include:  Worsening sore throat with pus and difficulty swallowing.  Swollen neck glands.  Chills and a high or persistent fever.  Severe headache.  Tenderness over the sinuses.  Persistent overall ill feeling (malaise), muscle aches, and tiredness (fatigue).  Persistent cough.  Yellow, green, or brown mucus production with coughing. HOME CARE INSTRUCTIONS   Only take over-the-counter or prescription medicines for pain, discomfort, diarrhea, or fever as directed by your caregiver.  Drink enough water and fluids to keep your urine clear or pale yellow. Sports drinks can provide valuable electrolytes, sugars, and hydration.  Get plenty of rest and maintain proper nutrition. Soups and broths with crackers or rice are fine. SEEK IMMEDIATE MEDICAL CARE IF:   You have severe headaches, shortness of breath, chest pain, neck pain, or an unusual rash.  You have uncontrolled vomiting, diarrhea, or you are unable to keep down fluids.  You or your child has an oral  temperature above 102 F (38.9 C), not controlled by medicine.  Your baby is older than 3 months with a rectal temperature of 102 F (38.9 C) or higher.  Your baby is 673 months old or younger with a rectal temperature of 100.4 F (38 C) or higher. MAKE SURE YOU:   Understand these instructions.  Will watch your condition.  Will get help right away if you are not doing well or get worse.   This information is not intended to replace advice given to you by your health care provider. Make sure you discuss any questions you have with your health care provider.   Document Released: 05/22/2005 Document Revised: 11/04/2011 Document Reviewed: 01/18/2015 Elsevier Interactive Patient Education [employer]2016 Elsevier Inc.

## 2015-11-09 NOTE — ED Notes (Signed)
Pt amb to room 12 with quick steady gait in nad, pt reports sore throat and subjective temps x 3 days, with cough producing yellow sputum.

## 2015-11-12 LAB — CULTURE, GROUP A STREP (THRC)

## 2017-03-20 IMAGING — CR DG CHEST 2V
2 series · 2 of 2 positions shown · non-contrast
Comparison: None.

CLINICAL DATA: Cough and fever for 4 days

EXAM:
CHEST  2 VIEW

[w chest pa]
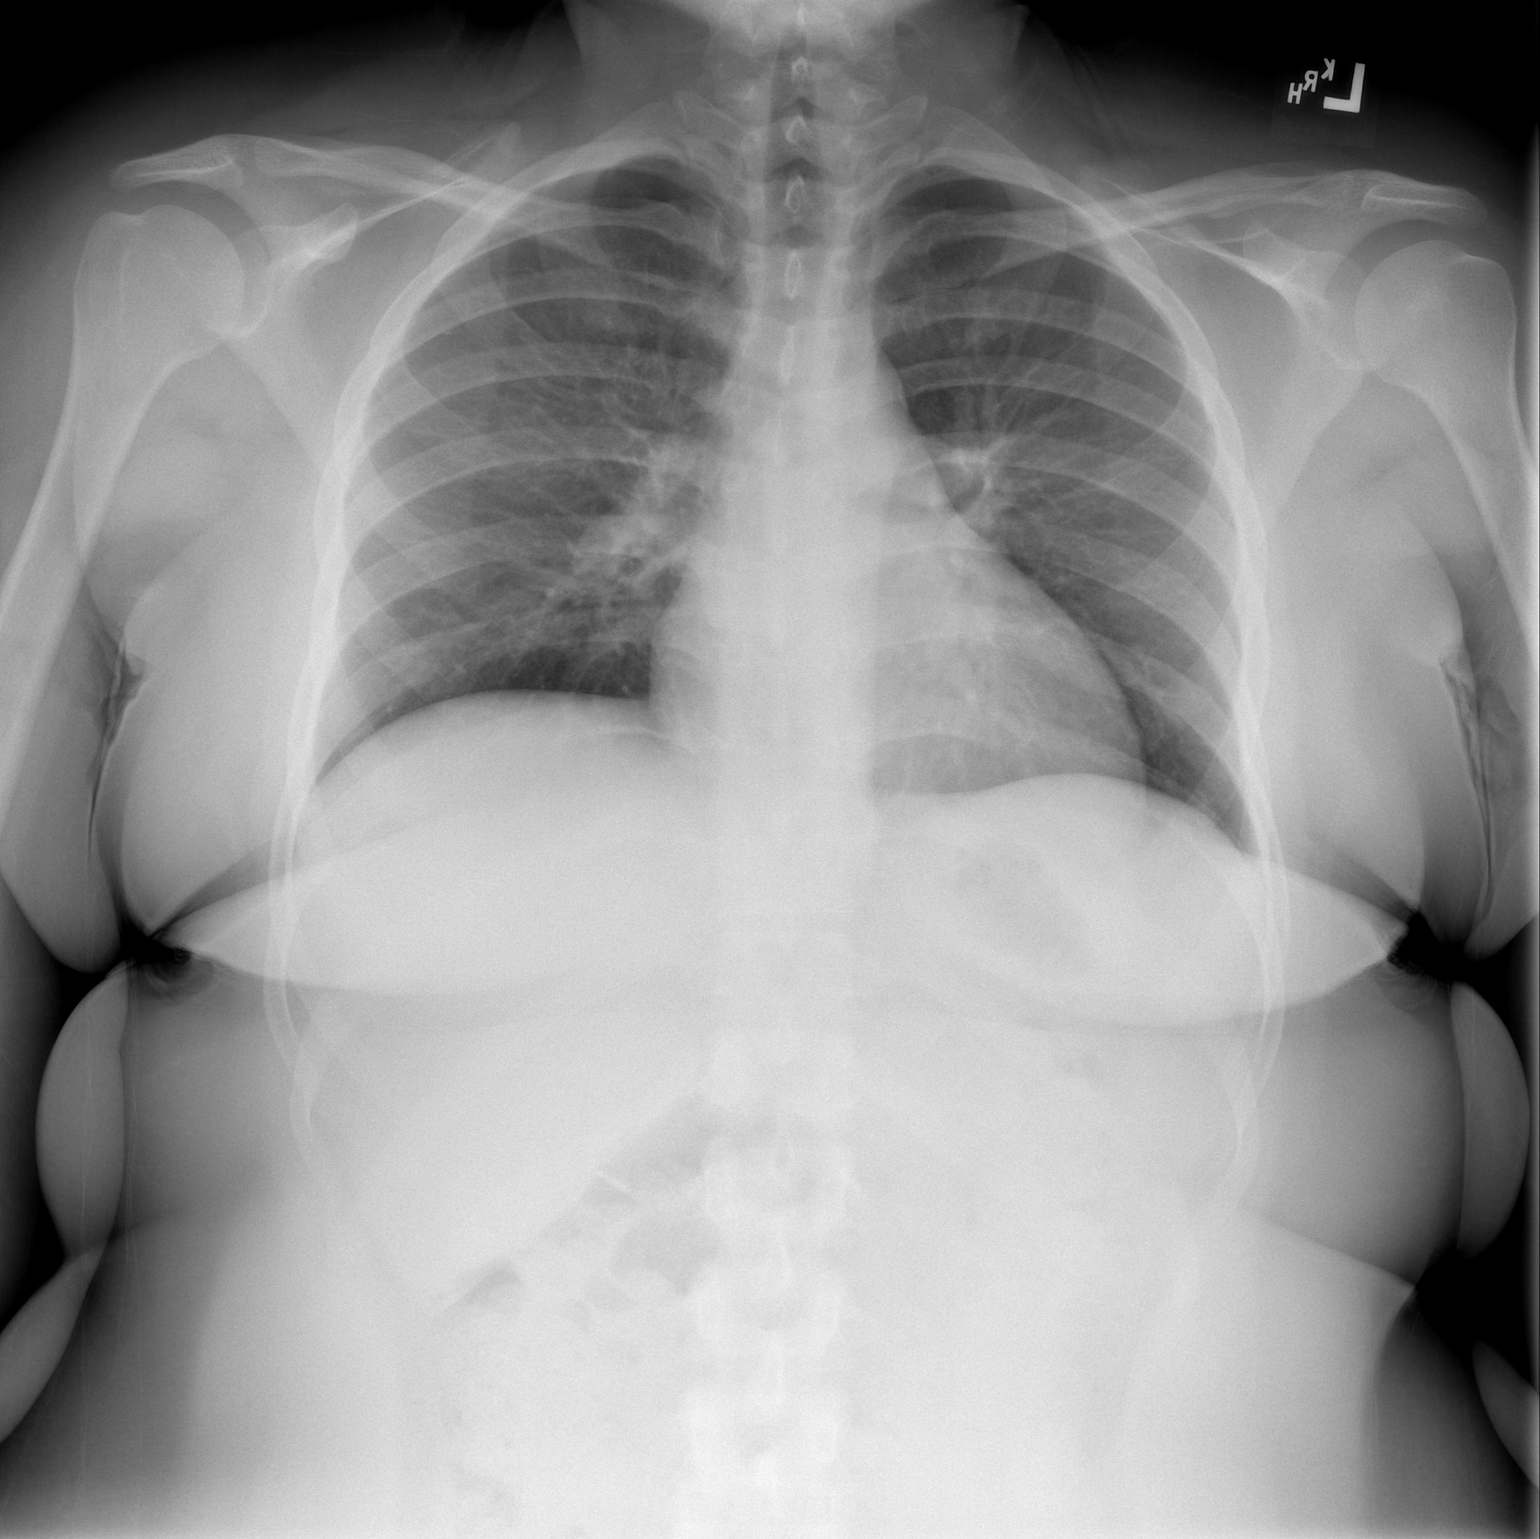

[w chest lat]
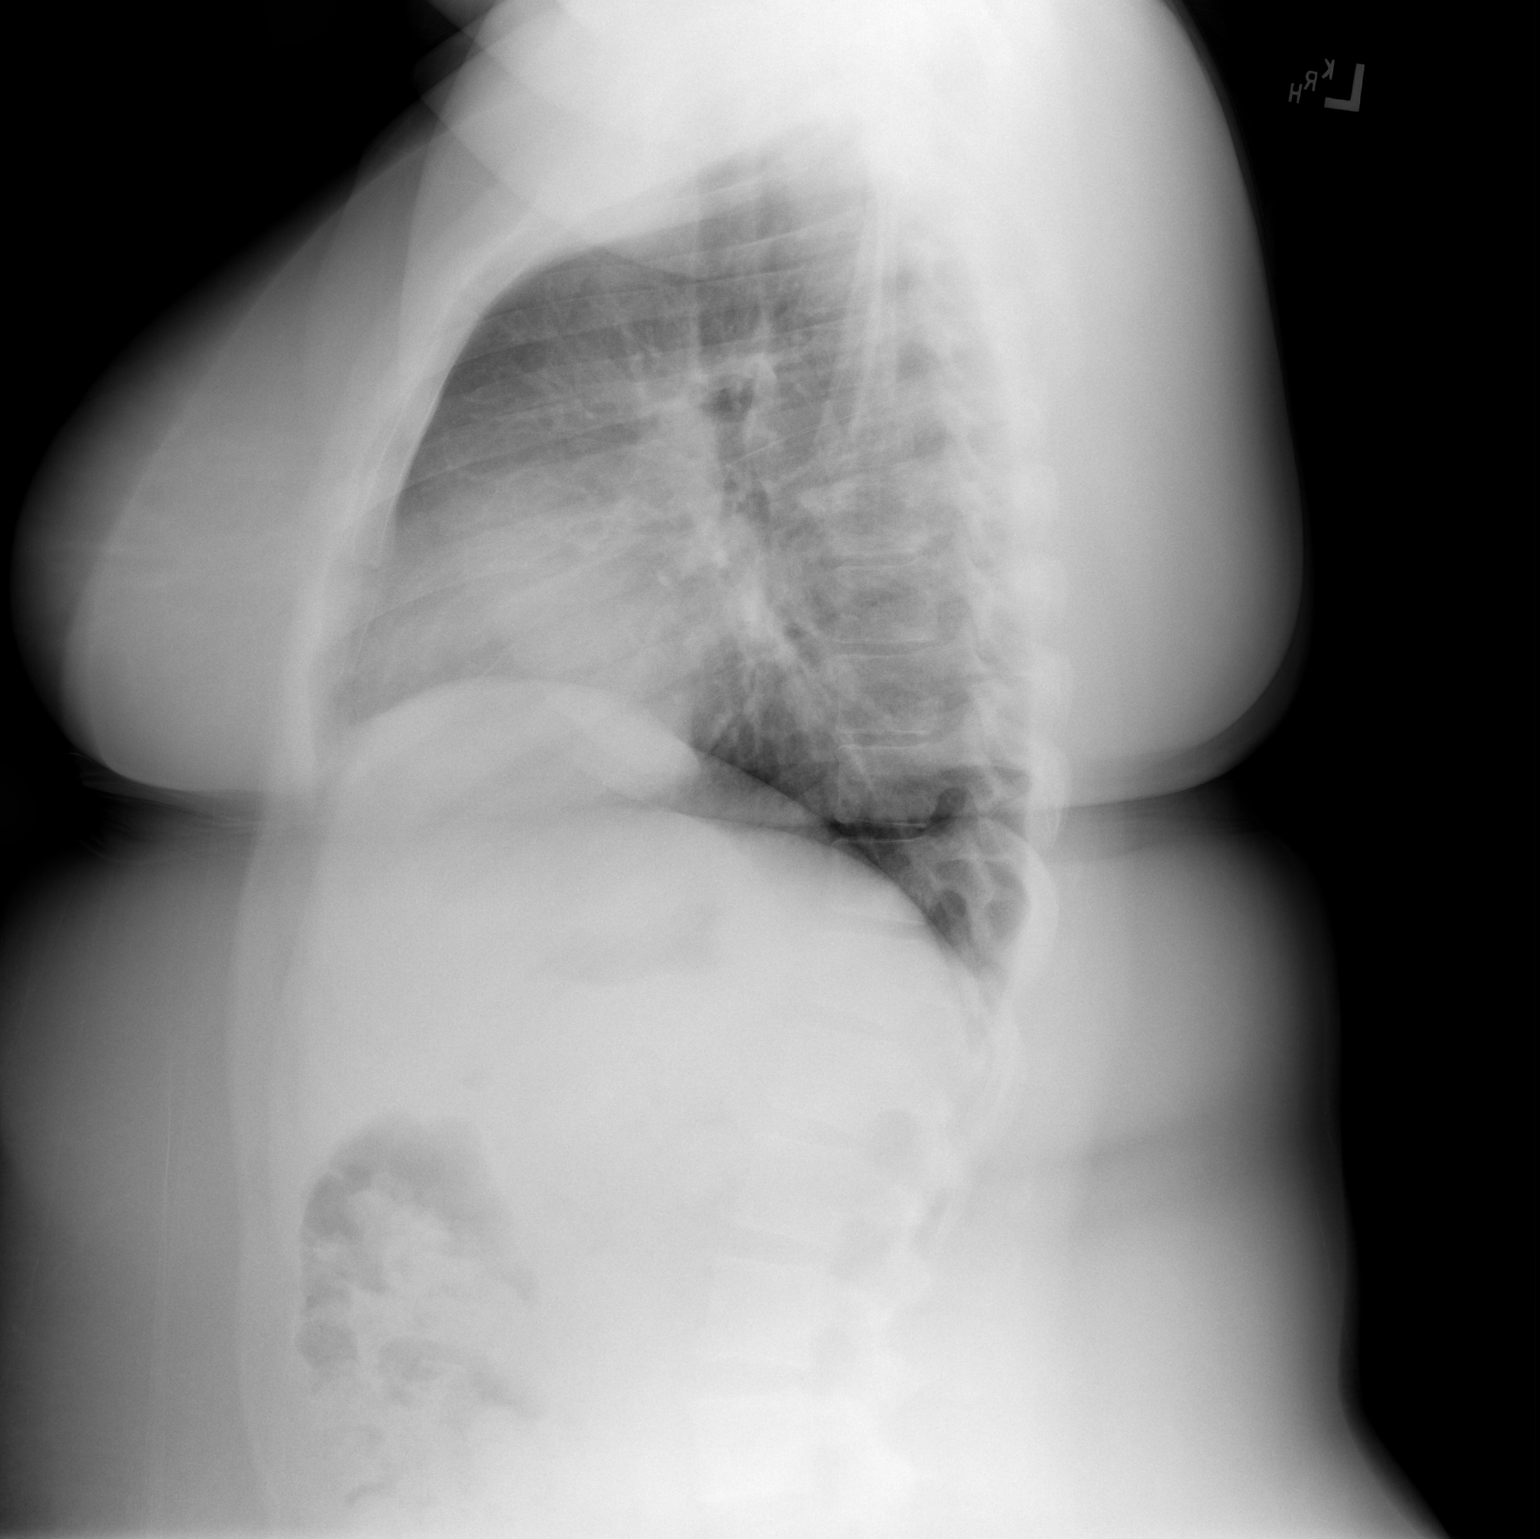

[2 of 2 positions shown; findings below may reference images not displayed]

FINDINGS: The heart size and mediastinal contours are within normal limits.
Both lungs are clear. The visualized skeletal structures are
unremarkable.
IMPRESSION: No active cardiopulmonary disease.

## 2019-01-25 ENCOUNTER — Encounter: Payer: Self-pay | Admitting: Emergency Medicine

## 2019-01-25 ENCOUNTER — Other Ambulatory Visit (HOSPITAL_COMMUNITY)
Admission: RE | Admit: 2019-01-25 | Discharge: 2019-01-25 | Disposition: A | Discharge status: Home / Self Care | Payer: BLUE CROSS/BLUE SHIELD | Source: Ambulatory Visit | Attending: Family Medicine | Admitting: Family Medicine

## 2019-01-25 ENCOUNTER — Other Ambulatory Visit: Payer: Self-pay

## 2019-01-25 ENCOUNTER — Emergency Department (INDEPENDENT_AMBULATORY_CARE_PROVIDER_SITE_OTHER)
Admission: EM | Admit: 2019-01-25 | Discharge: 2019-01-25 | Disposition: A | Discharge status: Home / Self Care | Payer: BLUE CROSS/BLUE SHIELD | Source: Home / Self Care

## 2019-01-25 DIAGNOSIS — R300 Dysuria: Secondary | ICD-10-CM | POA: Diagnosis not present

## 2019-01-25 DIAGNOSIS — R319 Hematuria, unspecified: Secondary | ICD-10-CM

## 2019-01-25 DIAGNOSIS — Z79899 Other long term (current) drug therapy: Secondary | ICD-10-CM | POA: Diagnosis not present

## 2019-01-25 DIAGNOSIS — N390 Urinary tract infection, site not specified: Secondary | ICD-10-CM

## 2019-01-25 LAB — POCT URINALYSIS DIP (MANUAL ENTRY)
Bilirubin, UA: NEGATIVE
Blood, UA: NEGATIVE
Glucose, UA: NEGATIVE mg/dL
Ketones, POC UA: NEGATIVE mg/dL
Nitrite, UA: NEGATIVE
Protein Ur, POC: NEGATIVE mg/dL
Spec Grav, UA: 1.025 (ref 1.010–1.025)
Urobilinogen, UA: 1 E.U./dL
pH, UA: 7 (ref 5.0–8.0)

## 2019-01-25 MED ORDER — CEPHALEXIN 500 MG PO CAPS: 500.0000 mg | ORAL_CAPSULE | Freq: Four times a day (QID) | ORAL | 0 refills | Status: AC

## 2019-01-25 NOTE — ED Triage Notes (Signed)
2-3 days, doesn't feel like she can completely empty her bladder.

## 2019-01-25 NOTE — ED Provider Notes (Signed)
[facility]    CSN: [id number] Arrival date & time: 01/25/19  1720     History   Chief Complaint Chief Complaint  Patient presents with  . Dysuria    HPI Carrie Koch is a 28 y.o. female.   The history is provided by the patient.  Dysuria  Pain quality:  Aching Pain severity:  Mild Onset quality:  Gradual Duration:  2 days Timing:  Constant Progression:  Worsening Chronicity:  New Relieved by:  Nothing Worsened by:  Nothing Ineffective treatments:  None tried Urinary symptoms: frequent urination   Associated symptoms: no abdominal pain     No past medical history on file.  There are no active problems to display for this patient.   No past surgical history on file.  OB History    Gravida  1   Para      Term      Preterm      AB      Living        SAB      TAB      Ectopic      Multiple      Live Births               Home Medications    Prior to Admission medications   Medication Sig Start Date End Date Taking? Authorizing Provider  cephALEXin (KEFLEX) 500 MG capsule Take 1 capsule (500 mg total) by mouth 4 (four) times daily for 10 days. 01/25/19 02/04/19  Elson AreasSofia, Vere Diantonio K, PA-C  guaiFENesin (ROBITUSSIN) 100 MG/5ML liquid Take 5-10 mLs (100-200 mg total) by mouth every 4 (four) hours as needed for cough. 11/09/15   Mackuen, Courteney Lyn, MD  ibuprofen (ADVIL,MOTRIN) 800 MG tablet Take 1 tablet (800 mg total) by mouth 3 (three) times daily. 11/09/15   Mackuen, Cindee Saltourteney Lyn, MD  Prenatal Vit-Fe Fumarate-FA (PRENATAL MULTIVITAMIN) TABS tablet Take 1 tablet by mouth daily at 12 noon.    [provider]    Family History Family History  Problem Relation Age of Onset  . Healthy Mother   . Healthy Father     Social History Social History   Tobacco Use  . Smoking status: Never Smoker  . Smokeless tobacco: Never Used  Substance Use Topics  . Alcohol use: Yes    Comment: occasional  . Drug use: No      Allergies   Patient has no known allergies.   Review of Systems Review of Systems  Gastrointestinal: Negative for abdominal pain.  Genitourinary: Positive for dysuria.  All other systems reviewed and are negative.    Physical Exam Triage Vital Signs ED Triage Vitals  Enc Vitals Group     BP 01/25/19 1744 (!) 156/88     Pulse Rate 01/25/19 1744 (!) 55     Resp --      Temp 01/25/19 1744 98.7 F (37.1 C)     Temp Source 01/25/19 1744 Oral     SpO2 01/25/19 1744 98 %     Weight 01/25/19 1745 300 lb (136.1 kg)     Height 01/25/19 1745 5\' 4"  (1.626 m)     Head Circumference --      Peak Flow --      Pain Score 01/25/19 1745 3     Pain Loc --      Pain Edu? --      Excl. in GC? --    No data found.  Updated Vital Signs BP Marland Kitchen(!)  156/88 (BP Location: Right Arm)   Pulse (!) 55   Temp 98.7 F (37.1 C) (Oral)   Ht 5\' 4"  (1.626 m)   Wt 136.1 kg   SpO2 98%   BMI 51.49 kg/m   Visual Acuity Right Eye Distance:   Left Eye Distance:   Bilateral Distance:    Right Eye Near:   Left Eye Near:    Bilateral Near:     Physical Exam Vitals signs and nursing note reviewed.  Constitutional:      Appearance: She is well-developed.  HENT:     Head: Normocephalic.  Neck:     Musculoskeletal: Normal range of motion.  Pulmonary:     Effort: Pulmonary effort is normal.  Abdominal:     General: There is no distension.  Musculoskeletal: Normal range of motion.  Neurological:     Mental Status: She is alert and oriented to person, place, and time.      UC Treatments / Results  Labs (all labs ordered are listed, but only abnormal results are displayed) Labs Reviewed  URINE CULTURE  POCT URINALYSIS DIP (MANUAL ENTRY)  URINE CYTOLOGY ANCILLARY ONLY    EKG None  Radiology No results found.  Procedures Procedures (including critical care time)  Medications Ordered in UC Medications - No data to display  Initial Impression / Assessment and Plan / UC Course  I have  reviewed the triage vital signs and the nursing notes.  Pertinent labs & imaging results that were available during my care of the patient were reviewed by me and considered in my medical decision making (see chart for details).     MDM  Urine shows leukocytes and hemoglobin.  Pt would like gc/ct testing.  No symptoms currently.  Pt given rx for keflex  Final Clinical Impressions(s) / UC Diagnoses   Final diagnoses:  Urinary tract infection with hematuria, site unspecified     Discharge Instructions     Return if any problems.    ED Prescriptions    Medication Sig Dispense Auth. Provider   cephALEXin (KEFLEX) 500 MG capsule Take 1 capsule (500 mg total) by mouth 4 (four) times daily for 10 days. 40 capsule Elson Areas, [place]     Controlled Substance Prescriptions [place] Controlled Substance Registry consulted? Not Applicable  An After Visit Summary was printed and given to the patient.    Elson Areas, [place] 01/25/19 1758

## 2019-01-25 NOTE — Discharge Instructions (Signed)
Return if any problems.

## 2019-01-26 LAB — URINE CULTURE
MICRO NUMBER:: 523940
SPECIMEN QUALITY:: ADEQUATE

## 2019-01-27 ENCOUNTER — Telehealth: Payer: Self-pay | Admitting: Emergency Medicine

## 2019-01-27 LAB — URINE CYTOLOGY ANCILLARY ONLY
Chlamydia: NEGATIVE
Neisseria Gonorrhea: NEGATIVE

## 2019-01-27 NOTE — Telephone Encounter (Signed)
GC, Neg

## 2019-04-15 ENCOUNTER — Encounter (HOSPITAL_BASED_OUTPATIENT_CLINIC_OR_DEPARTMENT_OTHER): Payer: Self-pay | Admitting: *Deleted

## 2019-04-15 ENCOUNTER — Other Ambulatory Visit: Payer: Self-pay

## 2019-04-15 ENCOUNTER — Emergency Department (HOSPITAL_BASED_OUTPATIENT_CLINIC_OR_DEPARTMENT_OTHER)
Admission: EM | Admit: 2019-04-15 | Discharge: 2019-04-15 | Disposition: A | Discharge status: Home / Self Care | Payer: BC Managed Care – PPO | Attending: Emergency Medicine | Admitting: Emergency Medicine

## 2019-04-15 ENCOUNTER — Emergency Department (HOSPITAL_BASED_OUTPATIENT_CLINIC_OR_DEPARTMENT_OTHER): Payer: BC Managed Care – PPO

## 2019-04-15 DIAGNOSIS — R0789 Other chest pain: Secondary | ICD-10-CM | POA: Insufficient documentation

## 2019-04-15 DIAGNOSIS — R079 Chest pain, unspecified: Secondary | ICD-10-CM

## 2019-04-15 LAB — CBG MONITORING, ED: Glucose-Capillary: 67 mg/dL — ABNORMAL LOW (ref 70–99)

## 2019-04-15 NOTE — ED Notes (Signed)
Patient transported to X-ray 

## 2019-04-15 NOTE — ED Provider Notes (Signed)
[facility] Provider Note MRN:  [id number]  Arrival date & time: 04/15/19     Chief Complaint   Chest Pain   History of Present Illness   Carrie Koch is a 28 y.o. year-old female with no pertinent past medical history presenting to the ED with chief complaint of chest pain.  Patient was experiencing a mild sharp pain behind her right eye yesterday for a few moments, then resolving.  Later that day she had a sharp pain to the left side of her neck, again resolving spontaneously.  Today she has a dull ache to the center of her chest.  She denies dizziness or diaphoresis, no nausea or vomiting, no shortness of breath.  Does not use birth control pills.  No leg pain or swelling.  Symptoms mild, constant, worse with laying flat.  Review of Systems  A complete 10 system review of systems was obtained and all systems are negative except as noted in the HPI and PMH.   Patient's Health History   History reviewed. No pertinent past medical history.  History reviewed. No pertinent surgical history.  Family History  Problem Relation Age of Onset  . Healthy Mother   . Healthy Father     Social History   Socioeconomic History  . Marital status: Single    Spouse name: Not on file  . Number of children: Not on file  . Years of education: Not on file  . Highest education level: Not on file  Occupational History  . Not on file  Social Needs  . Financial resource strain: Not on file  . Food insecurity    Worry: Not on file    Inability: Not on file  . Transportation needs    Medical: Not on file    Non-medical: Not on file  Tobacco Use  . Smoking status: Never Smoker  . Smokeless tobacco: Never Used  Substance and Sexual Activity  . Alcohol use: Yes    Comment: occasional  . Drug use: No  . Sexual activity: Yes    Birth control/protection: None  Lifestyle  . Physical activity    Days per week: Not on file    Minutes per  session: Not on file  . Stress: Not on file  Relationships  . Social Musicianconnections    Talks on phone: Not on file    Gets together: Not on file    Attends religious service: Not on file    Active member of club or organization: Not on file    Attends meetings of clubs or organizations: Not on file    Relationship status: Not on file  . Intimate partner violence    Fear of current or ex partner: Not on file    Emotionally abused: Not on file    Physically abused: Not on file    Forced sexual activity: Not on file  Other Topics Concern  . Not on file  Social History Narrative  . Not on file     Physical Exam  Vital Signs and Nursing Notes reviewed Vitals:   04/15/19 1104 04/15/19 1200  BP: (!) 132/96   Pulse: 72 (!) 57  Resp: 20 (!) 22  Temp: 98.8 F (37.1 C)   SpO2: 100% 100%    CONSTITUTIONAL: Well-appearing, NAD NEURO:  Alert and oriented x 3, no focal deficits EYES:  eyes equal and reactive ENT/NECK:  no LAD, no JVD CARDIO: Regular rate, well-perfused, normal S1 and S2 PULM:  CTAB  no wheezing or rhonchi GI/GU:  normal bowel sounds, non-distended, non-tender MSK/SPINE:  No gross deformities, no edema SKIN:  no rash, atraumatic PSYCH:  Appropriate speech and behavior  Diagnostic and Interventional Summary    EKG Interpretation  Date/Time:  Thursday April 15 2019 11:12:32 EDT Ventricular Rate:  62 PR Interval:    QRS Duration: 93 QT Interval:  411 QTC Calculation: 418 R Axis:   55 Text Interpretation:  Sinus arrhythmia Borderline T abnormalities, anterior leads Baseline wander in lead(s) V3 Confirmed by Gerlene Fee [phone]) on 04/15/2019 11:53:09 AM      Labs Reviewed  CBG MONITORING, ED - Abnormal; Notable for the following components:      Result Value   Glucose-Capillary 67 (*)    All other components within normal limits    DG Chest 2 View  Final Result      Medications - No data to display   Procedures Critical Care  ED Course and Medical  Decision Making  I have reviewed the triage vital signs and the nursing notes.  Pertinent labs & imaging results that were available during my care of the patient were reviewed by me and considered in my medical decision making (see below for details).  Very low concern for significant or emergent process, normal vital signs, PERC negative, EKG without acute concerns.  Awaiting chest x-ray to exclude pneumothorax or other evident pathology.  Anticipating discharge with PCP follow-up.  Barth Kirks. Sedonia Small, [place] [email or web address]  Final Clinical Impressions(s) / ED Diagnoses     ICD-10-CM   1. Chest pain  R07.9 DG Chest 2 View    DG Chest 2 View    ED Discharge Orders    None        Discharge Instructions     You were evaluated in the Emergency Department and after careful evaluation, we did not find any emergent condition requiring admission or further testing in the hospital.  Please return to the Emergency Department if you experience any worsening of your condition.  We encourage you to follow up with a primary care provider.  Thank you for allowing [place] to be a part of your care.        Maudie Flakes, MD 04/15/19 [phone]

## 2019-04-15 NOTE — ED Triage Notes (Signed)
Pressure behind her right eye and left side of her neck since last night. States this am she had pain under her left breast with feeling of light headedness.

## 2019-04-15 NOTE — ED Notes (Signed)
FSBS 67, given peanut butter crackers and soda , MD ED informed

## 2019-04-15 NOTE — Discharge Instructions (Signed)
You were evaluated in the Emergency Department and after careful evaluation, we did not find any emergent condition requiring admission or further testing in the hospital. ° °Please return to the Emergency Department if you experience any worsening of your condition.  We encourage you to follow up with a primary care provider.  Thank you for allowing us to be a part of your care. °

## 2019-08-05 ENCOUNTER — Other Ambulatory Visit: Payer: Self-pay

## 2019-08-05 ENCOUNTER — Emergency Department
Admission: EM | Admit: 2019-08-05 | Discharge: 2019-08-05 | Discharge status: Absent without leave | Payer: BC Managed Care – PPO | Source: Home / Self Care

## 2019-08-17 ENCOUNTER — Other Ambulatory Visit (HOSPITAL_COMMUNITY)
Admission: RE | Admit: 2019-08-17 | Discharge: 2019-08-17 | Disposition: A | Discharge status: Home / Self Care | Payer: BC Managed Care – PPO | Source: Ambulatory Visit | Attending: Family Medicine | Admitting: Family Medicine

## 2019-08-17 ENCOUNTER — Emergency Department (INDEPENDENT_AMBULATORY_CARE_PROVIDER_SITE_OTHER)
Admission: EM | Admit: 2019-08-17 | Discharge: 2019-08-17 | Disposition: A | Discharge status: Home / Self Care | Payer: BC Managed Care – PPO | Source: Home / Self Care

## 2019-08-17 ENCOUNTER — Encounter: Payer: Self-pay | Admitting: Emergency Medicine

## 2019-08-17 ENCOUNTER — Other Ambulatory Visit: Payer: Self-pay

## 2019-08-17 DIAGNOSIS — Z202 Contact with and (suspected) exposure to infections with a predominantly sexual mode of transmission: Secondary | ICD-10-CM | POA: Insufficient documentation

## 2019-08-17 DIAGNOSIS — R300 Dysuria: Secondary | ICD-10-CM

## 2019-08-17 LAB — POCT URINALYSIS DIP (MANUAL ENTRY)
Bilirubin, UA: NEGATIVE
Blood, UA: NEGATIVE
Glucose, UA: NEGATIVE mg/dL
Ketones, POC UA: NEGATIVE mg/dL
Leukocytes, UA: NEGATIVE
Nitrite, UA: NEGATIVE
Protein Ur, POC: NEGATIVE mg/dL
Spec Grav, UA: 1.025 (ref 1.010–1.025)
Urobilinogen, UA: 1 E.U./dL
pH, UA: 7.5 (ref 5.0–8.0)

## 2019-08-17 LAB — POCT URINE PREGNANCY: Preg Test, Ur: NEGATIVE

## 2019-08-17 NOTE — ED Provider Notes (Signed)
[facility]    CSN: [id number] Arrival date & time: 08/17/19  1308      History   Chief Complaint Chief Complaint  Patient presents with  . Exposure to STD    HPI Carrie Koch is a 28 y.o. female.   28 year old female, with history of obesity, presenting today for STD testing.  Patient states that she made appointment to be seen here 2 weeks ago but left prior to being seen.  Had some dysuria at that time.  States that the dysuria is since resolved but she would like STD testing to be sure.  She has been sexually active.  Denies any vaginal bleeding or discharge.  Patient would also like blood work done.  The history is provided by the patient.  Exposure to STD This is a new problem. The current episode started more than 1 week ago. The problem occurs constantly. The problem has not changed since onset.Pertinent negatives include no chest pain, no abdominal pain, no headaches and no shortness of breath. Nothing aggravates the symptoms. Nothing relieves the symptoms. She has tried nothing for the symptoms. The treatment provided no relief.    History reviewed. No pertinent past medical history.  There are no problems to display for this patient.   History reviewed. No pertinent surgical history.  OB History    Gravida  1   Para      Term      Preterm      AB      Living        SAB      TAB      Ectopic      Multiple      Live Births               Home Medications    Prior to Admission medications   Medication Sig Start Date End Date Taking? Authorizing Provider  ferrous sulfate 324 MG TBEC Take 324 mg by mouth.   Yes [provider]  ibuprofen (ADVIL,MOTRIN) 800 MG tablet Take 1 tablet (800 mg total) by mouth 3 (three) times daily. 11/09/15   Mackuen, Fredia Sorrow, MD    Family History Family History  Problem Relation Age of Onset  . Healthy Mother   . Healthy Father     Social History Social History   Tobacco Use   . Smoking status: Never Smoker  . Smokeless tobacco: Never Used  Substance Use Topics  . Alcohol use: Yes    Comment: occasional  . Drug use: No     Allergies   Patient has no known allergies.   Review of Systems Review of Systems  Constitutional: Negative for chills and fever.  HENT: Negative for ear pain and sore throat.   Eyes: Negative for pain and visual disturbance.  Respiratory: Negative for cough and shortness of breath.   Cardiovascular: Negative for chest pain and palpitations.  Gastrointestinal: Negative for abdominal pain and vomiting.  Genitourinary: Negative for dysuria and hematuria.  Musculoskeletal: Negative for arthralgias and back pain.  Skin: Negative for color change and rash.  Neurological: Negative for seizures, syncope and headaches.  All other systems reviewed and are negative.    Physical Exam Triage Vital Signs ED Triage Vitals  Enc Vitals Group     BP 08/17/19 1342 130/84     Pulse Rate 08/17/19 1342 79     Resp --      Temp 08/17/19 1342 99.1 F (37.3 C)  Temp Source 08/17/19 1342 Oral     SpO2 08/17/19 1342 100 %     Weight 08/17/19 1343 (!) 330 lb (149.7 kg)     Height 08/17/19 1343 5\' 4"  (1.626 m)     Head Circumference --      Peak Flow --      Pain Score 08/17/19 1342 0     Pain Loc --      Pain Edu? --      Excl. in GC? --    No data found.  Updated Vital Signs BP 130/84 (BP Location: Right Arm)   Pulse 79   Temp 99.1 F (37.3 C) (Oral)   Ht 5\' 4"  (1.626 m)   Wt (!) 330 lb (149.7 kg)   LMP 07/18/2019   SpO2 100%   BMI 56.64 kg/m   Visual Acuity Right Eye Distance:   Left Eye Distance:   Bilateral Distance:    Right Eye Near:   Left Eye Near:    Bilateral Near:     Physical Exam Vitals and nursing note reviewed.  Constitutional:      General: She is not in acute distress.    Appearance: She is well-developed.  HENT:     Head: Normocephalic and atraumatic.  Eyes:     Conjunctiva/sclera: Conjunctivae  normal.  Cardiovascular:     Rate and Rhythm: Normal rate and regular rhythm.     Heart sounds: No murmur.  Pulmonary:     Effort: Pulmonary effort is normal. No respiratory distress.     Breath sounds: Normal breath sounds.  Abdominal:     Palpations: Abdomen is soft.     Tenderness: There is no abdominal tenderness.  Musculoskeletal:     Cervical back: Neck supple.  Skin:    General: Skin is warm and dry.  Neurological:     Mental Status: She is alert.      UC Treatments / Results  Labs (all labs ordered are listed, but only abnormal results are displayed) Labs Reviewed  GC/CHLAMYDIA PROBE AMP  RPR  HSV(HERPES SIMPLEX VRS) I + II AB-IGG  HIV ANTIBODY (ROUTINE TESTING W REFLEX)  POCT URINE PREGNANCY  POCT URINALYSIS DIP (MANUAL ENTRY)  GC/CHLAMYDIA PROBE AMP ([place]) NOT AT [facility]    EKG   Radiology No results found.  Procedures Procedures (including critical care time)  Medications Ordered in UC Medications - No data to display  Initial Impression / Assessment and Plan / UC Course  I have reviewed the triage vital signs and the nursing notes.  Pertinent labs & imaging results that were available during my care of the patient were reviewed by me and considered in my medical decision making (see chart for details).     Pregnancy test as well as urinalysis within normal limits.  Remainder of the STD work-up pending. Final Clinical Impressions(s) / UC Diagnoses   Final diagnoses:  Possible exposure to STD  Dysuria     Discharge Instructions     Your pregnancy test was negative.  Your urinalysis does not appear to be infected.  We will call you with results of your other tests once they come back.  These will also be available to you on MyChart    ED Prescriptions    None     PDMP not reviewed this encounter.   07/20/2019, [facility] 08/17/19 1426

## 2019-08-17 NOTE — Discharge Instructions (Addendum)
Your pregnancy test was negative.  Your urinalysis does not appear to be infected.  We will call you with results of your other tests once they come back.  These will also be available to you on MyChart

## 2019-08-17 NOTE — ED Triage Notes (Signed)
Dysuria x 2 weeks but it has resolved, denies problems today, wants full STD testing.

## 2019-08-18 LAB — HIV ANTIBODY (ROUTINE TESTING W REFLEX): HIV 1&2 Ab, 4th Generation: NONREACTIVE

## 2019-08-18 LAB — RPR: RPR Ser Ql: NONREACTIVE

## 2019-08-18 LAB — GC/CHLAMYDIA PROBE AMP (~~LOC~~) NOT AT ARMC
Chlamydia: NEGATIVE
Neisseria Gonorrhea: NEGATIVE

## 2019-08-18 LAB — HSV(HERPES SIMPLEX VRS) I + II AB-IGG
HAV 1 IGG,TYPE SPECIFIC AB: <0.9 index
HSV 2 IGG,TYPE SPECIFIC AB: 11.6 index — ABNORMAL HIGH

## 2019-08-19 ENCOUNTER — Telehealth: Payer: Self-pay | Admitting: Emergency Medicine

## 2019-08-19 NOTE — Telephone Encounter (Signed)
Discussed HSV 2 antibody results with patient. All questions answered.

## 2020-11-10 ENCOUNTER — Emergency Department (HOSPITAL_BASED_OUTPATIENT_CLINIC_OR_DEPARTMENT_OTHER)
Admission: EM | Admit: 2020-11-10 | Discharge: 2020-11-11 | Disposition: A | Discharge status: Home / Self Care | Payer: BC Managed Care – PPO | Attending: Emergency Medicine | Admitting: Emergency Medicine

## 2020-11-10 ENCOUNTER — Encounter (HOSPITAL_BASED_OUTPATIENT_CLINIC_OR_DEPARTMENT_OTHER): Payer: Self-pay | Admitting: *Deleted

## 2020-11-10 ENCOUNTER — Other Ambulatory Visit: Payer: Self-pay

## 2020-11-10 DIAGNOSIS — R5383 Other fatigue: Secondary | ICD-10-CM | POA: Diagnosis not present

## 2020-11-10 DIAGNOSIS — R519 Headache, unspecified: Secondary | ICD-10-CM

## 2020-11-10 DIAGNOSIS — H5319 Other subjective visual disturbances: Secondary | ICD-10-CM | POA: Insufficient documentation

## 2020-11-10 LAB — CBC WITH DIFFERENTIAL/PLATELET
Abs Immature Granulocytes: 0.03 10*3/uL (ref 0.00–0.07)
Basophils Absolute: 0 10*3/uL (ref 0.0–0.1)
Basophils Relative: 0 %
Eosinophils Absolute: 0.1 10*3/uL (ref 0.0–0.5)
Eosinophils Relative: 1 %
HCT: 35.9 % — ABNORMAL LOW (ref 36.0–46.0)
Hemoglobin: 11.2 g/dL — ABNORMAL LOW (ref 12.0–15.0)
Immature Granulocytes: 0 %
Lymphocytes Relative: 31 %
Lymphs Abs: 2.7 10*3/uL (ref 0.7–4.0)
MCH: 26.5 pg (ref 26.0–34.0)
MCHC: 31.2 g/dL (ref 30.0–36.0)
MCV: 84.9 fL (ref 80.0–100.0)
Monocytes Absolute: 0.5 10*3/uL (ref 0.1–1.0)
Monocytes Relative: 6 %
Neutro Abs: 5.5 10*3/uL (ref 1.7–7.7)
Neutrophils Relative %: 62 %
Platelets: 253 10*3/uL (ref 150–400)
RBC: 4.23 MIL/uL (ref 3.87–5.11)
RDW: 15.2 % (ref 11.5–15.5)
WBC: 8.9 10*3/uL (ref 4.0–10.5)
nRBC: 0 % (ref 0.0–0.2)

## 2020-11-10 LAB — COMPREHENSIVE METABOLIC PANEL
ALT: 14 U/L (ref 0–44)
AST: 15 U/L (ref 15–41)
Albumin: 3.8 g/dL (ref 3.5–5.0)
Alkaline Phosphatase: 87 U/L (ref 38–126)
Anion gap: 10 (ref 5–15)
BUN: 10 mg/dL (ref 6–20)
CO2: 26 mmol/L (ref 22–32)
Calcium: 8.8 mg/dL — ABNORMAL LOW (ref 8.9–10.3)
Chloride: 100 mmol/L (ref 98–111)
Creatinine, Ser: 0.75 mg/dL (ref 0.44–1.00)
GFR, Estimated: >60 mL/min (ref >60)
Glucose, Bld: 96 mg/dL (ref 70–99)
Potassium: 3.7 mmol/L (ref 3.5–5.1)
Sodium: 136 mmol/L (ref 135–145)
Total Bilirubin: 0.5 mg/dL (ref 0.3–1.2)
Total Protein: 8.1 g/dL (ref 6.5–8.1)

## 2020-11-10 LAB — URINALYSIS, ROUTINE W REFLEX MICROSCOPIC
Bilirubin Urine: NEGATIVE
Glucose, UA: NEGATIVE mg/dL
Hgb urine dipstick: NEGATIVE
Ketones, ur: NEGATIVE mg/dL
Leukocytes,Ua: NEGATIVE
Nitrite: NEGATIVE
Protein, ur: NEGATIVE mg/dL
Specific Gravity, Urine: >1.03 — ABNORMAL HIGH (ref 1.005–1.030)
pH: 5.5 (ref 5.0–8.0)

## 2020-11-10 MED ORDER — DIPHENHYDRAMINE HCL 50 MG/ML IJ SOLN
25.0000 mg | Freq: Once | INTRAMUSCULAR | Status: AC
  Administered 2020-11-10: 25 mg via INTRAVENOUS
  Filled 2020-11-10: qty 1

## 2020-11-10 MED ORDER — PROCHLORPERAZINE EDISYLATE 10 MG/2ML IJ SOLN
10.0000 mg | Freq: Once | INTRAMUSCULAR | Status: AC
  Administered 2020-11-10: 10 mg via INTRAVENOUS
  Filled 2020-11-10: qty 2

## 2020-11-10 MED ORDER — SODIUM CHLORIDE 0.9 % IV BOLUS
1000.0000 mL | Freq: Once | INTRAVENOUS | Status: AC
  Administered 2020-11-10: 1000 mL via INTRAVENOUS

## 2020-11-10 NOTE — ED Triage Notes (Signed)
Headache x 4 days

## 2020-11-10 NOTE — ED Provider Notes (Addendum)
[facility] EMERGENCY DEPARTMENT Provider Note   CSN: [id number] Arrival date & time: 11/10/20  1853     History Chief Complaint  Patient presents with  . Headache    Carrie Koch is a 30 y.o. female.  The history is provided by the patient and medical records. No language interpreter was used.  Headache Pain location:  Generalized Quality:  Dull Radiates to:  Does not radiate Severity currently:  7/10 Severity at highest:  9/10 Onset quality:  Gradual Duration:  4 days Timing:  Constant Progression:  Waxing and waning Chronicity:  New Context: bright light and loud noise   Relieved by:  Nothing Worsened by:  Light Ineffective treatments:  None tried Associated symptoms: fatigue and photophobia   Associated symptoms: no abdominal pain, no back pain, no blurred vision, no congestion, no cough, no diarrhea, no dizziness, no drainage, no facial pain, no fever, no loss of balance, no nausea, no neck pain, no neck stiffness, no numbness, no paresthesias, no seizures, no sinus pressure, no tingling, no URI, no visual change, no vomiting and no weakness        History reviewed. No pertinent past medical history.  There are no problems to display for this patient.   History reviewed. No pertinent surgical history.   OB History    Gravida  1   Para      Term      Preterm      AB      Living        SAB      IAB      Ectopic      Multiple      Live Births              Family History  Problem Relation Age of Onset  . Healthy Mother   . Healthy Father     Social History   Tobacco Use  . Smoking status: Never Smoker  . Smokeless tobacco: Never Used  Vaping Use  . Vaping Use: Never used  Substance Use Topics  . Alcohol use: Yes    Comment: occasional  . Drug use: No    Home Medications Prior to Admission medications   Medication Sig Start Date End Date Taking? Authorizing Provider  ferrous sulfate 324 MG TBEC Take 324 mg by  mouth.   Yes [provider]  ibuprofen (ADVIL,MOTRIN) 800 MG tablet Take 1 tablet (800 mg total) by mouth 3 (three) times daily. 11/09/15   Mackuen, Cindee Salt, MD    Allergies    Patient has no known allergies.  Review of Systems   Review of Systems  Constitutional: Positive for fatigue. Negative for diaphoresis and fever.  HENT: Negative for congestion, postnasal drip and sinus pressure.   Eyes: Positive for photophobia. Negative for blurred vision.  Respiratory: Negative for cough, chest tightness, shortness of breath, wheezing and stridor.   Cardiovascular: Negative for chest pain, palpitations and leg swelling.  Gastrointestinal: Negative for abdominal pain, diarrhea, nausea and vomiting.  Genitourinary: Negative for dysuria and flank pain.  Musculoskeletal: Negative for back pain, neck pain and neck stiffness.  Skin: Negative for rash and wound.  Neurological: Positive for headaches. Negative for dizziness, seizures, weakness, light-headedness, numbness, paresthesias and loss of balance.  Psychiatric/Behavioral: Negative for agitation and confusion.  All other systems reviewed and are negative.   Physical Exam Updated Vital Signs BP 113/72   Pulse 96   Temp 99 F (37.2 C) (Oral)   Resp  14   Ht 5\' 4"  (1.626 m)   Wt (!) 146.5 kg   LMP 10/27/2020   SpO2 98%   BMI 55.44 kg/m   Physical Exam Vitals and nursing note reviewed.  Constitutional:      General: She is not in acute distress.    Appearance: She is well-developed. She is not ill-appearing, toxic-appearing or diaphoretic.  HENT:     Head: Normocephalic and atraumatic.     Nose: No congestion or rhinorrhea.     Mouth/Throat:     Mouth: Mucous membranes are moist.  Eyes:     Conjunctiva/sclera: Conjunctivae normal.     Pupils: Pupils are equal, round, and reactive to light.  Cardiovascular:     Rate and Rhythm: Normal rate and regular rhythm.     Heart sounds: No murmur heard.   Pulmonary:      Effort: Pulmonary effort is normal. No respiratory distress.     Breath sounds: Normal breath sounds. No wheezing, rhonchi or rales.  Chest:     Chest wall: No tenderness.  Abdominal:     General: Abdomen is flat.     Palpations: Abdomen is soft.     Tenderness: There is no abdominal tenderness.  Musculoskeletal:        General: No tenderness.     Cervical back: Normal range of motion and neck supple. No rigidity or tenderness.  Skin:    General: Skin is warm and dry.     Capillary Refill: Capillary refill takes less than 2 seconds.  Neurological:     General: No focal deficit present.     Mental Status: She is alert and oriented to person, place, and time.     Cranial Nerves: No cranial nerve deficit or dysarthria.     Sensory: No sensory deficit.     Motor: No weakness.     Coordination: Coordination normal.  Psychiatric:        Mood and Affect: Mood normal. Mood is not anxious.     ED Results / Procedures / Treatments   Labs (all labs ordered are listed, but only abnormal results are displayed) Labs Reviewed  CBC WITH DIFFERENTIAL/PLATELET - Abnormal; Notable for the following components:      Result Value   Hemoglobin 11.2 (*)    HCT 35.9 (*)    All other components within normal limits  COMPREHENSIVE METABOLIC PANEL - Abnormal; Notable for the following components:   Calcium 8.8 (*)    All other components within normal limits  URINALYSIS, ROUTINE W REFLEX MICROSCOPIC - Abnormal; Notable for the following components:   APPearance CLOUDY (*)    Specific Gravity, Urine >1.030 (*)    All other components within normal limits    EKG None  Radiology No results found.  Procedures Procedures   Medications Ordered in ED Medications  sodium chloride 0.9 % bolus 1,000 mL (1,000 mLs Intravenous New Bag/Given 11/10/20 2328)  prochlorperazine (COMPAZINE) injection 10 mg (10 mg Intravenous Given 11/10/20 2330)  diphenhydrAMINE (BENADRYL) injection 25 mg (25 mg  Intravenous Given 11/10/20 2328)    ED Course  I have reviewed the triage vital signs and the nursing notes.  Pertinent labs & imaging results that were available during my care of the patient were reviewed by me and considered in my medical decision making (see chart for details).    MDM Rules/Calculators/A&P  Carrie Koch is a 30 y.o. female with no significant past medical history who presents with several days of headache.  She reports that for the last 4 days she has had photophobia and phonophobia and headache.  She reports that she took some BC powder which potentially helped slightly but was still concerned.  She denies any visual changes or neurologic deficits.  She denies difficulty speaking.  She denies any fevers.  She reports some general muscle soreness but denies any focal nuchal rigidity.  She reports the headache is been waxing and waning.  She denies trauma.  She denies any chest pain, shortness of breath, nausea, vomiting, constipation, diarrhea, or urinary changes.  On exam, lungs clear.  No focal neurologic deficits.  Chest and back nontender.  Neck has some paraspinal soreness but no midline tenderness.  No nuchal rigidity.  Pupil symmetric reactive normal extraocular movements.  Patient well-appearing.  Clinically I suspect atypical migraine.  Given her well appearance and exam,  have extremely low suspicion for significant intracranial abnormality such as intracranial hemorrhage or meningitis.  Had a shared decision made conversation patient we agreed to give a headache cocktail and reassess.  If she is feeling better, anticipate discharge home for PCP follow-up.  She did have some screening labs performed in triage due to the fatigue and they were overall reassuring.  No leukocytosis to suggest infection.  Urine is concentrated and I suspect some dehydration and patient agrees.  Mucous membranes were dry.  Care transferred to oncoming team while  waiting for reassessment after headache cocktail.   Final Clinical Impression(s) / ED Diagnoses Final diagnoses:  Acute nonintractable headache, unspecified headache type    Clinical Impression: 1. Acute nonintractable headache, unspecified headache type     Disposition: Care transferred to oncoming team while waiting for reassessment after headache cocktail.  This note was prepared with assistance of Conservation officer, historic buildings. Occasional wrong-word or sound-a-like substitutions may have occurred due to the inherent limitations of voice recognition software.      Burlon Centrella, Canary Brim, MD 11/11/20 0011   12:30 AM Patient reports that her headache is much better and she would like to go home now.  Given her improvement, low suspicion for more concerning etiology as we discussed.  Patient discharged home with improvement in headache for PCP follow-up and to maintain hydration at home.     Gloris Shiroma, Canary Brim, MD 11/11/20 0030

## 2020-11-11 NOTE — Discharge Instructions (Signed)
Your history and exam today are consistent with an atypical migraine headache.  Your exam was completely reassuring and after the medications your headache was feeling better.  We discussed that we have a low suspicion for an infectious cause of your symptoms or intracranial abnormality.  Please follow-up with your primary doctor and rest and stay hydrated.  If any symptoms change or worsen acutely, please return to the nearest emergency department.

## 2020-11-12 ENCOUNTER — Other Ambulatory Visit: Payer: Self-pay

## 2020-11-12 ENCOUNTER — Emergency Department (INDEPENDENT_AMBULATORY_CARE_PROVIDER_SITE_OTHER)
Admission: EM | Admit: 2020-11-12 | Discharge: 2020-11-12 | Disposition: A | Discharge status: Home / Self Care | Payer: BC Managed Care – PPO | Source: Home / Self Care

## 2020-11-12 ENCOUNTER — Encounter: Payer: Self-pay | Admitting: Emergency Medicine

## 2020-11-12 DIAGNOSIS — R519 Headache, unspecified: Secondary | ICD-10-CM | POA: Diagnosis not present

## 2020-11-12 MED ORDER — KETOROLAC TROMETHAMINE 60 MG/2ML IM SOLN
60.0000 mg | Freq: Once | INTRAMUSCULAR | Status: AC
  Administered 2020-11-12: 60 mg via INTRAMUSCULAR

## 2020-11-12 NOTE — ED Provider Notes (Signed)
[facility]    CSN: [id number] Arrival date & time: 11/12/20  1259      History   Chief Complaint Chief Complaint  Patient presents with  . Headache    HPI Carrie Koch is a 30 y.o. female.   Patient here c/w headache and neck pain x 4 days.  She was seen in ED 2 days ago, CBC and other blood work normal.  She states pain is much better today, 3 / 10 on pain scale.  She is concerned about having meningitis.  denies any sick sx such as f/c, n/v.  She denies neck stiffness.  She has not taken any ibuprofen or tylenol since leaving ED.       History reviewed. No pertinent past medical history.  There are no problems to display for this patient.   History reviewed. No pertinent surgical history.  OB History    Gravida  1   Para      Term      Preterm      AB      Living        SAB      IAB      Ectopic      Multiple      Live Births               Home Medications    Prior to Admission medications   Medication Sig Start Date End Date Taking? Authorizing Provider  ferrous sulfate 324 MG TBEC Take 324 mg by mouth.    [provider]  ibuprofen (ADVIL,MOTRIN) 800 MG tablet Take 1 tablet (800 mg total) by mouth 3 (three) times daily. 11/09/15   Mackuen, Cindee Salt, MD    Family History Family History  Problem Relation Age of Onset  . Healthy Mother   . Healthy Father     Social History Social History   Tobacco Use  . Smoking status: Never Smoker  . Smokeless tobacco: Never Used  Vaping Use  . Vaping Use: Never used  Substance Use Topics  . Alcohol use: Yes    Comment: occasional  . Drug use: No     Allergies   Patient has no known allergies.   Review of Systems Review of Systems  Constitutional: Negative for chills, fatigue and fever.  HENT: Negative for congestion, ear pain, nosebleeds, postnasal drip, rhinorrhea, sinus pressure, sinus pain and sore throat.   Eyes: Negative for pain and redness.   Respiratory: Negative for cough, shortness of breath and wheezing.   Gastrointestinal: Negative for abdominal pain, diarrhea, nausea and vomiting.  Musculoskeletal: Positive for myalgias and neck pain. Negative for arthralgias, back pain, gait problem, joint swelling and neck stiffness.  Skin: Negative for rash.  Neurological: Positive for headaches. Negative for light-headedness.  Hematological: Negative for adenopathy. Does not bruise/bleed easily.  Psychiatric/Behavioral: Negative for confusion and sleep disturbance.     Physical Exam Triage Vital Signs ED Triage Vitals  Enc Vitals Group     BP 11/12/20 1350 126/78     Pulse Rate 11/12/20 1350 89     Resp 11/12/20 1350 16     Temp 11/12/20 1350 98.7 F (37.1 C)     Temp Source 11/12/20 1350 Oral     SpO2 11/12/20 1350 100 %     Weight --      Height --      Head Circumference --      Peak Flow --      Pain  Score 11/12/20 1349 3     Pain Loc --      Pain Edu? --      Excl. in GC? --    No data found.  Updated Vital Signs BP 126/78 (BP Location: Right Arm)   Pulse 89   Temp 98.7 F (37.1 C) (Oral)   Resp 16   LMP 10/27/2020   SpO2 100%   Breastfeeding No   Visual Acuity Right Eye Distance:   Left Eye Distance:   Bilateral Distance:    Right Eye Near:   Left Eye Near:    Bilateral Near:     Physical Exam Vitals and nursing note reviewed.  Constitutional:      General: She is not in acute distress.    Appearance: Normal appearance. She is not ill-appearing.  HENT:     Head: Normocephalic and atraumatic.     Right Ear: Tympanic membrane and ear canal normal.     Left Ear: Tympanic membrane and ear canal normal.  Eyes:     General: No scleral icterus.    Extraocular Movements: Extraocular movements intact.     Conjunctiva/sclera: Conjunctivae normal.     Pupils: Pupils are equal, round, and reactive to light.  Pulmonary:     Effort: Pulmonary effort is normal. No respiratory distress.   Musculoskeletal:     Cervical back: Normal range of motion. No rigidity.  Skin:    Capillary Refill: Capillary refill takes less than 2 seconds.     Coloration: Skin is not jaundiced.     Findings: No rash.  Neurological:     General: No focal deficit present.     Mental Status: She is alert and oriented to person, place, and time.     GCS: GCS eye subscore is 4. GCS verbal subscore is 5. GCS motor subscore is 6.     Cranial Nerves: Cranial nerves are intact. No cranial nerve deficit or facial asymmetry.     Motor: No weakness, tremor or abnormal muscle tone.     Gait: Gait normal.     Deep Tendon Reflexes:     Reflex Scores:      Patellar reflexes are 2+ on the right side and 2+ on the left side. Psychiatric:        Mood and Affect: Mood normal.        Behavior: Behavior normal.      UC Treatments / Results  Labs (all labs ordered are listed, but only abnormal results are displayed) Labs Reviewed - No data to display  EKG   Radiology No results found.  Procedures Procedures (including critical care time)  Medications Ordered in UC Medications - No data to display  Initial Impression / Assessment and Plan / UC Course  I have reviewed the triage vital signs and the nursing notes.  Pertinent labs & imaging results that were available during my care of the patient were reviewed by me and considered in my medical decision making (see chart for details).     Take ibuprofen or tylenol as needed, do not take any ibuprofen until tomorrow morning.  Follow up with PCP  ED precautions provided. Final Clinical Impressions(s) / UC Diagnoses   Final diagnoses:  None   Discharge Instructions   None    ED Prescriptions    None     PDMP not reviewed this encounter.   Evern Core, PA-C 11/12/20 1406

## 2020-11-12 NOTE — Discharge Instructions (Addendum)
Take ibuprofen or tylenol as needed, do not take any ibuprofen until tomorrow morning.  Follow up with PCP  ED precautions provided.

## 2020-11-12 NOTE — ED Triage Notes (Signed)
C/o headache - concerned about meningitis  HA is better since being seen at [facility] ER Denies fever or chills

## 2020-11-15 ENCOUNTER — Telehealth: Payer: BC Managed Care – PPO | Admitting: Family

## 2020-11-15 DIAGNOSIS — H00014 Hordeolum externum left upper eyelid: Secondary | ICD-10-CM | POA: Diagnosis not present

## 2020-11-15 MED ORDER — BACITRACIN-POLYMYX-NEO-HC 1 % OP OINT: TOPICAL_OINTMENT | OPHTHALMIC | 0 refills | Status: DC

## 2020-11-15 NOTE — Progress Notes (Signed)
We are sorry that you are not feeling well. Here is how we plan to help!  Based on what you have shared with me it looks like you have a stye.  A stye is an inflammation of the eyelid.  It is often a red, painful lump near the edge of the eyelid that may look like a boil or a pimple.  A stye develops when an infection occurs at the base of an eyelash.   We have made appropriate suggestions for you based upon your presentation: Your symptoms may indicate an infection.  The use of anti-inflammatory and antibiotic eye drops for a week will help resolve this condition.  I have sent in Cortisporin 1% ointment that you apply to your ear every 4 hours.  If your symptoms do not improve over the next two to three days you should be seen in your doctor's office.  HOME CARE:   Wash your hands often!  Let the stye open on its own. Don't squeeze or open it.  Don't rub your eyes. This can irritate your eyes and let in bacteria.  If you need to touch your eyes, wash your hands first.  Don't wear eye makeup or contact lenses until the area has healed.  GET HELP RIGHT AWAY IF:   Your symptoms do not improve.  You develop blurred or loss of vision.  Your symptoms worsen (increased discharge, pain or redness).  Thank you for choosing an e-visit.  Your e-visit answers were reviewed by a board certified advanced clinical practitioner to complete your personal care plan.  Depending upon the condition, your plan could have included both over the counter or prescription medications.  Please review your pharmacy choice.  Make sure the pharmacy is open so you can pick up prescription now.  If there is a problem, you may contact your provider through [employer] and have the prescription routed to another pharmacy.    Your safety is important to [place].  If you have drug allergies check your prescription carefully.  For the next 24 hours you can use MyChart to ask questions about today's visit, request a  non-urgent call back, or ask for a work or school excuse.  You will get an email in the next two days asking about your experience.  I hope you that your e-visit has been valuable and will speed your recovery.  Approximately 5 minutes was spent documenting and reviewing patient's chart.

## 2020-11-16 MED ORDER — DOXYCYCLINE HYCLATE 100 MG PO TABS: 100.0000 mg | ORAL_TABLET | Freq: Two times a day (BID) | ORAL | 0 refills | Status: DC

## 2020-11-16 NOTE — Addendum Note (Signed)
Addended by: Lurene Shadow on: 11/16/2020 08:04 AM   Modules accepted: Orders

## 2021-06-01 ENCOUNTER — Encounter (HOSPITAL_BASED_OUTPATIENT_CLINIC_OR_DEPARTMENT_OTHER): Payer: Self-pay

## 2021-06-01 ENCOUNTER — Emergency Department (HOSPITAL_BASED_OUTPATIENT_CLINIC_OR_DEPARTMENT_OTHER)
Admission: EM | Admit: 2021-06-01 | Discharge: 2021-06-01 | Disposition: A | Discharge status: Home / Self Care | Payer: BC Managed Care – PPO | Attending: Emergency Medicine | Admitting: Emergency Medicine

## 2021-06-01 ENCOUNTER — Other Ambulatory Visit: Payer: Self-pay

## 2021-06-01 DIAGNOSIS — N920 Excessive and frequent menstruation with regular cycle: Secondary | ICD-10-CM | POA: Insufficient documentation

## 2021-06-01 DIAGNOSIS — R109 Unspecified abdominal pain: Secondary | ICD-10-CM | POA: Diagnosis present

## 2021-06-01 DIAGNOSIS — N921 Excessive and frequent menstruation with irregular cycle: Secondary | ICD-10-CM

## 2021-06-01 LAB — URINALYSIS, ROUTINE W REFLEX MICROSCOPIC
Bilirubin Urine: NEGATIVE
Glucose, UA: NEGATIVE mg/dL
Ketones, ur: NEGATIVE mg/dL
Leukocytes,Ua: NEGATIVE
Nitrite: NEGATIVE
Protein, ur: NEGATIVE mg/dL
Specific Gravity, Urine: 1.02 (ref 1.005–1.030)
pH: 7 (ref 5.0–8.0)

## 2021-06-01 LAB — CBC WITH DIFFERENTIAL/PLATELET
Abs Immature Granulocytes: 0.03 10*3/uL (ref 0.00–0.07)
Basophils Absolute: 0 10*3/uL (ref 0.0–0.1)
Basophils Relative: 0 %
Eosinophils Absolute: 0.1 10*3/uL (ref 0.0–0.5)
Eosinophils Relative: 1 %
HCT: 30.9 % — ABNORMAL LOW (ref 36.0–46.0)
Hemoglobin: 9.7 g/dL — ABNORMAL LOW (ref 12.0–15.0)
Immature Granulocytes: 0 %
Lymphocytes Relative: 36 %
Lymphs Abs: 2.7 10*3/uL (ref 0.7–4.0)
MCH: 26.3 pg (ref 26.0–34.0)
MCHC: 31.4 g/dL (ref 30.0–36.0)
MCV: 83.7 fL (ref 80.0–100.0)
Monocytes Absolute: 0.3 10*3/uL (ref 0.1–1.0)
Monocytes Relative: 4 %
Neutro Abs: 4.2 10*3/uL (ref 1.7–7.7)
Neutrophils Relative %: 59 %
Platelets: 227 10*3/uL (ref 150–400)
RBC: 3.69 MIL/uL — ABNORMAL LOW (ref 3.87–5.11)
RDW: 15.9 % — ABNORMAL HIGH (ref 11.5–15.5)
WBC: 7.4 10*3/uL (ref 4.0–10.5)
nRBC: 0 % (ref 0.0–0.2)

## 2021-06-01 LAB — COMPREHENSIVE METABOLIC PANEL
ALT: 11 U/L (ref 0–44)
AST: 14 U/L — ABNORMAL LOW (ref 15–41)
Albumin: 3.2 g/dL — ABNORMAL LOW (ref 3.5–5.0)
Alkaline Phosphatase: 87 U/L (ref 38–126)
Anion gap: 5 (ref 5–15)
BUN: 7 mg/dL (ref 6–20)
CO2: 26 mmol/L (ref 22–32)
Calcium: 8.5 mg/dL — ABNORMAL LOW (ref 8.9–10.3)
Chloride: 105 mmol/L (ref 98–111)
Creatinine, Ser: 0.76 mg/dL (ref 0.44–1.00)
GFR, Estimated: >60 mL/min (ref >60)
Glucose, Bld: 81 mg/dL (ref 70–99)
Potassium: 3.5 mmol/L (ref 3.5–5.1)
Sodium: 136 mmol/L (ref 135–145)
Total Bilirubin: 0.5 mg/dL (ref 0.3–1.2)
Total Protein: 7.3 g/dL (ref 6.5–8.1)

## 2021-06-01 LAB — URINALYSIS, MICROSCOPIC (REFLEX)

## 2021-06-01 LAB — PREGNANCY, URINE: Preg Test, Ur: NEGATIVE

## 2021-06-01 MED ORDER — IBUPROFEN 800 MG PO TABS: 800.0000 mg | ORAL_TABLET | Freq: Three times a day (TID) | ORAL | 0 refills | Status: DC | PRN

## 2021-06-01 NOTE — ED Notes (Signed)
Patient given discharge instructions, all questions answered. Patient in possession of all belongings, directed to the discharge area  

## 2021-06-01 NOTE — ED Triage Notes (Signed)
Pt arrives ambulatory to ED with c/o pain to lower abdomen that is constant. Pt reports she did not have her period for 60 days and has now started her period but reports it has been very heavy and painful. Pt reports this cycle started on the 17th. Pt did take home pregnancy test that was negative, unsure of when she took home test. Denies any urinary symptoms. Denies NVD.

## 2021-06-01 NOTE — ED Provider Notes (Signed)
[facility] EMERGENCY DEPARTMENT Provider Note  CSN: [id number] Arrival date & time: 06/01/21 0827    History Chief Complaint  Patient presents with   Abdominal Pain    Carrie Koch is a 30 y.o. female with no significant PMH reports she did not have a menstrual cycle for about two months over the summer, began bleeding and having lower abdominal discomfort. She was initially passing some large clots, continues having some bleeding. She took a home pregnancy test wchih was neg. She has not been to see her PCP. She tried taking a left over doxycycline from prior prescription she does not remember but no improvement. She reports some heaviness when she urinates. No vomiting, diarrhea, CP, SOB or lightheadedness.    History reviewed. No pertinent past medical history.  History reviewed. No pertinent surgical history.  Family History  Problem Relation Age of Onset   Healthy Mother    Healthy Father     Social History   Tobacco Use   Smoking status: Never   Smokeless tobacco: Never  Vaping Use   Vaping Use: Never used  Substance Use Topics   Alcohol use: Yes    Comment: occasional   Drug use: No     Home Medications Prior to Admission medications   Medication Sig Start Date End Date Taking? Authorizing Provider  baci-polymyx-neo-hydrocort (CORTISPORIN) 1 % ointment Place into the left eye every 4 (four) hours. 11/15/20   Jannifer Rodney A, FNP  ferrous sulfate 324 MG TBEC Take 324 mg by mouth.    [provider]  ibuprofen (ADVIL) 800 MG tablet Take 1 tablet (800 mg total) by mouth every 8 (eight) hours as needed. 06/01/21   Pollyann Savoy, MD     Allergies    Patient has no known allergies.   Review of Systems   Review of Systems A comprehensive review of systems was completed and negative except as noted in HPI.    Physical Exam BP 124/72 (BP Location: Right Arm)   Pulse 74   Temp 98.6 F (37 C) (Oral)   Resp 16   Ht 5\' 4"  (1.626 m)    Wt (!) 145.2 kg   LMP 06/01/2021   SpO2 100%   BMI 54.93 kg/m   Physical Exam Vitals and nursing note reviewed.  Constitutional:      Appearance: Normal appearance.  HENT:     Head: Normocephalic and atraumatic.     Nose: Nose normal.     Mouth/Throat:     Mouth: Mucous membranes are moist.  Eyes:     Extraocular Movements: Extraocular movements intact.     Conjunctiva/sclera: Conjunctivae normal.  Cardiovascular:     Rate and Rhythm: Normal rate.  Pulmonary:     Effort: Pulmonary effort is normal.     Breath sounds: Normal breath sounds.  Abdominal:     General: Abdomen is flat.     Palpations: Abdomen is soft.     Tenderness: There is no abdominal tenderness. There is no guarding. Negative signs include Murphy's sign and McBurney's sign.  Musculoskeletal:        General: No swelling. Normal range of motion.     Cervical back: Neck supple.  Skin:    General: Skin is warm and dry.  Neurological:     General: No focal deficit present.     Mental Status: She is alert.  Psychiatric:        Mood and Affect: Mood normal.     ED  Results / Procedures / Treatments   Labs (all labs ordered are listed, but only abnormal results are displayed) Labs Reviewed  COMPREHENSIVE METABOLIC PANEL - Abnormal; Notable for the following components:      Result Value   Calcium 8.5 (*)    Albumin 3.2 (*)    AST 14 (*)    All other components within normal limits  CBC WITH DIFFERENTIAL/PLATELET - Abnormal; Notable for the following components:   RBC 3.69 (*)    Hemoglobin 9.7 (*)    HCT 30.9 (*)    RDW 15.9 (*)    All other components within normal limits  URINALYSIS, ROUTINE W REFLEX MICROSCOPIC - Abnormal; Notable for the following components:   Color, Urine COLORLESS (*)    Hgb urine dipstick MODERATE (*)    All other components within normal limits  URINALYSIS, MICROSCOPIC (REFLEX) - Abnormal; Notable for the following components:   Bacteria, UA RARE (*)    All other  components within normal limits  PREGNANCY, URINE    EKG None   Radiology No results found.  Procedures Procedures  Medications Ordered in the ED Medications - No data to display   MDM Rules/Calculators/A&P MDM Patient with menorrhagia, will check labs including pregnancy. Abdomen is benign.   ED Course  I have reviewed the triage vital signs and the nursing notes.  Pertinent labs & imaging results that were available during my care of the patient were reviewed by me and considered in my medical decision making (see chart for details).  Clinical Course as of 06/01/21 1026  Fri Jun 01, 2021  0950 CBC with anemia mildly decreased from previous, but not in need of transfusion.  [CS]  1015 CMP unremarkable. UA neg for infection, there is some blood but pregnancy is negative. Patient advised to follow up with Gyn. She asks where [place] would be helpful but given she is not pregnancy and no concern for emergent surgical process such as torsion or abscess, ED [place] is not indicated. She was encouraged to take NSAIDs as needed for pain.  [CS]    Clinical Course User Index [CS] Pollyann Savoy, MD    Final Clinical Impression(s) / ED Diagnoses Final diagnoses:  Menorrhagia with irregular cycle    Rx / DC Orders ED Discharge Orders          Ordered    ibuprofen (ADVIL) 800 MG tablet  Every 8 hours PRN        06/01/21 1025             Pollyann Savoy, MD 06/01/21 1026

## 2021-10-07 ENCOUNTER — Emergency Department: Admit: 2021-10-07 | Payer: Self-pay

## 2021-10-07 ENCOUNTER — Emergency Department (INDEPENDENT_AMBULATORY_CARE_PROVIDER_SITE_OTHER)
Admission: EM | Admit: 2021-10-07 | Discharge: 2021-10-07 | Disposition: A | Discharge status: Home / Self Care | Payer: BC Managed Care – PPO | Source: Home / Self Care

## 2021-10-07 ENCOUNTER — Other Ambulatory Visit (HOSPITAL_COMMUNITY)
Admission: RE | Admit: 2021-10-07 | Discharge: 2021-10-07 | Disposition: A | Discharge status: Home / Self Care | Payer: BC Managed Care – PPO | Source: Ambulatory Visit | Attending: Family Medicine | Admitting: Family Medicine

## 2021-10-07 ENCOUNTER — Other Ambulatory Visit: Payer: Self-pay

## 2021-10-07 DIAGNOSIS — R300 Dysuria: Secondary | ICD-10-CM

## 2021-10-07 LAB — POCT URINALYSIS DIP (MANUAL ENTRY)
Blood, UA: NEGATIVE
Glucose, UA: NEGATIVE mg/dL
Leukocytes, UA: NEGATIVE
Nitrite, UA: NEGATIVE
Protein Ur, POC: 30 mg/dL — AB
Spec Grav, UA: >=1.03 — AB (ref 1.010–1.025)
Urobilinogen, UA: 0.2 E.U./dL
pH, UA: 5.5 (ref 5.0–8.0)

## 2021-10-07 NOTE — ED Provider Notes (Signed)
[facility]    CSN: [id number] Arrival date & time: 10/07/21  1440      History   Chief Complaint Chief Complaint  Patient presents with   Dysuria    Pain with urination. X6 days    HPI Carrie Koch is a 31 y.o. female.   HPI 31 year old female presents with dysuria for 6 days.  History reviewed. No pertinent past medical history.  There are no problems to display for this patient.   History reviewed. No pertinent surgical history.  OB History     Gravida  1   Para      Term      Preterm      AB      Living         SAB      IAB      Ectopic      Multiple      Live Births               Home Medications    Prior to Admission medications   Medication Sig Start Date End Date Taking? Authorizing Provider  baci-polymyx-neo-hydrocort (CORTISPORIN) 1 % ointment Place into the left eye every 4 (four) hours. 11/15/20   Evelina Dun A, FNP  ferrous sulfate 324 MG TBEC Take 324 mg by mouth.    [provider]  ibuprofen (ADVIL) 800 MG tablet Take 1 tablet (800 mg total) by mouth every 8 (eight) hours as needed. 06/01/21   Truddie Hidden, MD    Family History Family History  Problem Relation Age of Onset   Healthy Mother    Healthy Father     Social History Social History   Tobacco Use   Smoking status: Never   Smokeless tobacco: Never  Vaping Use   Vaping Use: Never used  Substance Use Topics   Alcohol use: Yes    Comment: occasional   Drug use: No     Allergies   Patient has no known allergies.   Review of Systems Review of Systems  Genitourinary:  Positive for dysuria.    Physical Exam Triage Vital Signs ED Triage Vitals  Enc Vitals Group     BP 10/07/21 1450 133/82     Pulse Rate 10/07/21 1450 (!) 106     Resp 10/07/21 1450 18     Temp 10/07/21 1450 98.5 F (36.9 C)     Temp Source 10/07/21 1450 Oral     SpO2 10/07/21 1450 98 %     Weight 10/07/21 1447 (!) 320 lb (145.2 kg)     Height  10/07/21 1447 5\' 4"  (1.626 m)     Head Circumference --      Peak Flow --      Pain Score 10/07/21 1447 4     Pain Loc --      Pain Edu? --      Excl. in [place]? --    No data found.  Updated Vital Signs BP 133/82 (BP Location: Right Arm)    Pulse (!) 106    Temp 98.5 F (36.9 C) (Oral)    Resp 18    Ht 5\' 4"  (1.626 m)    Wt (!) 320 lb (145.2 kg)    LMP 09/26/2021    SpO2 98%    BMI 54.93 kg/m     Physical Exam Vitals and nursing note reviewed.  Constitutional:      General: She is not in acute distress.    Appearance:  Normal appearance. She is obese. She is not ill-appearing.  HENT:     Head: Normocephalic and atraumatic.     Mouth/Throat:     Mouth: Mucous membranes are moist.     Pharynx: Oropharynx is clear.  Eyes:     Extraocular Movements: Extraocular movements intact.     Conjunctiva/sclera: Conjunctivae normal.     Pupils: Pupils are equal, round, and reactive to light.  Cardiovascular:     Rate and Rhythm: Normal rate and regular rhythm.     Pulses: Normal pulses.     Heart sounds: Normal heart sounds.  Pulmonary:     Effort: Pulmonary effort is normal.     Breath sounds: Normal breath sounds.  Abdominal:     Tenderness: There is no right CVA tenderness or left CVA tenderness.  Musculoskeletal:     Cervical back: Normal range of motion and neck supple.  Skin:    General: Skin is warm and dry.  Neurological:     General: No focal deficit present.     Mental Status: She is alert and oriented to person, place, and time.     UC Treatments / Results  Labs (all labs ordered are listed, but only abnormal results are displayed) Labs Reviewed  POCT URINALYSIS DIP (MANUAL ENTRY) - Abnormal; Notable for the following components:      Result Value   Bilirubin, UA small (*)    Ketones, POC UA trace (5) (*)    Spec Grav, UA >=1.030 (*)    Protein Ur, POC =30 (*)    All other components within normal limits  CERVICOVAGINAL ANCILLARY ONLY    EKG   Radiology No  results found.  Procedures Procedures (including critical care time)  Medications Ordered in UC Medications - No data to display  Initial Impression / Assessment and Plan / UC Course  I have reviewed the triage vital signs and the nursing notes.  Pertinent labs & imaging results that were available during my care of the patient were reviewed by me and considered in my medical decision making (see chart for details).     MDM:1.  Dysuria-advised patient UA was unremarkable Aptima swab ordered for GC, chlamydia, and trichomonas.  Advised patient we will follow-up with lab results once received.  Patient discharged home, hemodynamically stable.  Patient discharged home, hemodynamically stable. Final Clinical Impressions(s) / UC Diagnoses   Final diagnoses:  Dysuria     Discharge Instructions      Advised patient that UA was unremarkable today and that Aptima swab has been ordered for GC, chlamydia, and trichomonas.  Advised patient we will follow-up with lab results once received.     ED Prescriptions   None    PDMP not reviewed this encounter.   Eliezer Lofts, [place] 10/07/21 1528

## 2021-10-07 NOTE — ED Triage Notes (Signed)
Pt states that she has some pain with urination. X6 days

## 2021-10-07 NOTE — Discharge Instructions (Addendum)
Advised patient that UA was unremarkable today and that Aptima swab has been ordered for GC, chlamydia, and trichomonas.  Advised patient we will follow-up with lab results once received.

## 2021-10-08 LAB — CERVICOVAGINAL ANCILLARY ONLY
Bacterial Vaginitis (gardnerella): POSITIVE — AB
Candida Glabrata: NEGATIVE
Candida Vaginitis: NEGATIVE
Chlamydia: NEGATIVE
Comment: NEGATIVE
Comment: NEGATIVE
Comment: NEGATIVE
Comment: NEGATIVE
Comment: NEGATIVE
Comment: NORMAL
Neisseria Gonorrhea: NEGATIVE
Trichomonas: NEGATIVE

## 2021-10-10 ENCOUNTER — Telehealth (HOSPITAL_COMMUNITY): Payer: Self-pay | Admitting: Emergency Medicine

## 2021-10-10 MED ORDER — METRONIDAZOLE 500 MG PO TABS: 500.0000 mg | ORAL_TABLET | Freq: Two times a day (BID) | ORAL | 0 refills | Status: DC

## 2021-11-15 DIAGNOSIS — Z113 Encounter for screening for infections with a predominantly sexual mode of transmission: Secondary | ICD-10-CM | POA: Diagnosis not present

## 2021-11-15 DIAGNOSIS — Z01419 Encounter for gynecological examination (general) (routine) without abnormal findings: Secondary | ICD-10-CM | POA: Diagnosis not present

## 2021-11-15 DIAGNOSIS — E669 Obesity, unspecified: Secondary | ICD-10-CM | POA: Diagnosis not present

## 2021-11-15 DIAGNOSIS — Z3202 Encounter for pregnancy test, result negative: Secondary | ICD-10-CM | POA: Diagnosis not present

## 2021-11-15 DIAGNOSIS — N926 Irregular menstruation, unspecified: Secondary | ICD-10-CM | POA: Diagnosis not present

## 2021-11-15 DIAGNOSIS — Z01411 Encounter for gynecological examination (general) (routine) with abnormal findings: Secondary | ICD-10-CM | POA: Diagnosis not present

## 2021-11-15 DIAGNOSIS — Z6841 Body Mass Index (BMI) 40.0 and over, adult: Secondary | ICD-10-CM | POA: Diagnosis not present

## 2022-06-19 DIAGNOSIS — Z113 Encounter for screening for infections with a predominantly sexual mode of transmission: Secondary | ICD-10-CM | POA: Diagnosis not present

## 2022-06-19 DIAGNOSIS — N760 Acute vaginitis: Secondary | ICD-10-CM | POA: Diagnosis not present

## 2022-06-19 DIAGNOSIS — N898 Other specified noninflammatory disorders of vagina: Secondary | ICD-10-CM | POA: Diagnosis not present

## 2022-06-19 DIAGNOSIS — B9689 Other specified bacterial agents as the cause of diseases classified elsewhere: Secondary | ICD-10-CM | POA: Diagnosis not present

## 2023-05-28 ENCOUNTER — Encounter: Payer: Self-pay | Admitting: Family Medicine

## 2023-05-28 ENCOUNTER — Ambulatory Visit (INDEPENDENT_AMBULATORY_CARE_PROVIDER_SITE_OTHER): Payer: BC Managed Care – PPO | Admitting: Family Medicine

## 2023-05-28 VITALS — BP 137/83 | HR 84 | Ht 64.0 in | Wt 323.0 lb

## 2023-05-28 DIAGNOSIS — N6342 Unspecified lump in left breast, subareolar: Secondary | ICD-10-CM | POA: Diagnosis not present

## 2023-05-28 DIAGNOSIS — R350 Frequency of micturition: Secondary | ICD-10-CM

## 2023-05-28 DIAGNOSIS — Z6841 Body Mass Index (BMI) 40.0 and over, adult: Secondary | ICD-10-CM

## 2023-05-28 DIAGNOSIS — R3129 Other microscopic hematuria: Secondary | ICD-10-CM

## 2023-05-28 DIAGNOSIS — E66813 Obesity, class 3: Secondary | ICD-10-CM | POA: Diagnosis not present

## 2023-05-28 DIAGNOSIS — F419 Anxiety disorder, unspecified: Secondary | ICD-10-CM | POA: Diagnosis not present

## 2023-05-28 LAB — POCT URINALYSIS DIP (MANUAL ENTRY)
Bilirubin, UA: NEGATIVE
Glucose, UA: NEGATIVE mg/dL
Ketones, POC UA: NEGATIVE mg/dL
Nitrite, UA: NEGATIVE
Protein Ur, POC: 30 mg/dL — AB
Spec Grav, UA: 1.025 (ref 1.010–1.025)
Urobilinogen, UA: 0.2 U/dL
pH, UA: 6 (ref 5.0–8.0)

## 2023-05-28 NOTE — Assessment & Plan Note (Signed)
Anxiety/Stress Reports feeling overwhelmed and possibly experiencing anxiety due to personal and professional responsibilities. No SI/HI -Referral to counseling services for further evaluation and support.

## 2023-05-28 NOTE — Assessment & Plan Note (Signed)
Updating labs today Encourage healthy diet, portion control, regular exercise

## 2023-05-28 NOTE — Progress Notes (Signed)
New Patient Office Visit  Subjective    Patient ID: Carrie Koch, female    DOB: 1991-08-09  Age: 32 y.o. MRN: [id number]  CC:  Chief Complaint  Patient presents with   Establish Care    HPI Carrie Koch presents to establish care   Discussed the use of AI scribe software for clinical note transcription with the patient, who gave verbal consent to proceed.  History of Present Illness   The patient, here to establish care with no significant past medical history, presents with a concern about a persistent breast lump. The lump, located under the left areola, has been present for approximately two and a half years. The patient describes it as possibly a little larger than when first noticed, but it remains painless and without associated nipple discharge. An ultrasound performed over 2 years ago identified the lump as a cyst, but no follow-up imaging has been conducted since (repeat imaging recommended after). The patient expresses interest in potential removal due to discomfort with its presence.  Additionally, the patient reports recent urinary frequency, with an instance of urinating four times within an hour. She suspects a possible urinary tract infection.  The patient also mentions feeling overwhelmed and anxious due to work and family responsibilities, expressing interest in counseling but reluctance towards medication. She lives with her three children, aged eleven, six, and seven. She works at [employer] [place]. She is sexually active with a female partner and is not currently using any form of birth control. She has a family history of heart failure in a grandmother.         05/28/2023    2:25 PM  PHQ9 SCORE ONLY  PHQ-9 Total Score 4      05/28/2023    2:25 PM  GAD 7 : Generalized Anxiety Score  Nervous, Anxious, on Edge 2  Control/stop worrying 1  Worry too much - different things 2  Trouble relaxing 1  Restless 0  Easily annoyed or irritable 3  Afraid - awful might  happen 0  Total GAD 7 Score 9  Anxiety Difficulty Somewhat difficult             Outpatient Encounter Medications as of 05/28/2023  Medication Sig   [DISCONTINUED] baci-polymyx-neo-hydrocort (CORTISPORIN) 1 % ointment Place into the left eye every 4 (four) hours.   [DISCONTINUED] ferrous sulfate 324 MG TBEC Take 324 mg by mouth.   [DISCONTINUED] ibuprofen (ADVIL) 800 MG tablet Take 1 tablet (800 mg total) by mouth every 8 (eight) hours as needed.   [DISCONTINUED] metroNIDAZOLE (FLAGYL) 500 MG tablet Take 1 tablet (500 mg total) by mouth 2 (two) times daily.   No facility-administered encounter medications on file as of 05/28/2023.    History reviewed. No pertinent past medical history.  History reviewed. No pertinent surgical history.  Family History  Problem Relation Age of Onset   Healthy Mother    Healthy Father    Heart failure Maternal Grandmother     Social History   Socioeconomic History   Marital status: Single    Spouse name: Not on file   Number of children: Not on file   Years of education: Not on file   Highest education level: Not on file  Occupational History   Not on file  Tobacco Use   Smoking status: Never   Smokeless tobacco: Never  Vaping Use   Vaping status: Never Used  Substance and Sexual Activity   Alcohol use: Yes    Comment: occasional  Drug use: No   Sexual activity: Yes    Partners: Male    Birth control/protection: None  Other Topics Concern   Not on file  Social History Narrative   Not on file   Social Determinants of Health   Financial Resource Strain: Not on file  Food Insecurity: Not on file  Transportation Needs: Not on file  Physical Activity: Not on file  Stress: Not on file  Social Connections: Not on file  Intimate Partner Violence: Not on file    ROS All review of systems negative except what is listed in the HPI      Objective    BP 137/83   Pulse 84   Ht 5\' 4"  (1.626 m)   Wt (!) 323 lb (146.5  kg)   SpO2 98%   BMI 55.44 kg/m   Physical Exam Vitals reviewed.  Constitutional:      Appearance: Normal appearance. She is obese.  Cardiovascular:     Rate and Rhythm: Normal rate and regular rhythm.     Heart sounds: Normal heart sounds.  Pulmonary:     Effort: Pulmonary effort is normal.     Breath sounds: Normal breath sounds.  Chest:    Skin:    General: Skin is warm and dry.  Neurological:     Mental Status: She is alert and oriented to person, place, and time.  Psychiatric:        Mood and Affect: Mood normal.        Behavior: Behavior normal.        Thought Content: Thought content normal.        Judgment: Judgment normal.         Assessment & Plan:   Problem List Items Addressed This Visit       Active Problems   Class 3 severe obesity without serious comorbidity with body mass index (BMI) of 50.0 to 59.9 in adult [facility])    Updating labs today Encourage healthy diet, portion control, regular exercise       Relevant Orders   CBC with Differential/Platelet   Comprehensive metabolic panel   Hemoglobin A1c   Lipid panel   TSH   Subareolar mass of left breast    Persistent cyst in the breast for approximately 2.5 years, possibly slightly increased in size. No associated pain or nipple discharge. Previous ultrasound in February 2022 confirmed cystic nature. -Order repeat breast ultrasound to reassess cyst. -Discuss potential referral for cyst removal, if desired by patient, after ultrasound results.      Relevant Orders   US BREAST COMPLETE UNI LEFT INC AXILLA   Anxiety    Anxiety/Stress Reports feeling overwhelmed and possibly experiencing anxiety due to personal and professional responsibilities. No SI/HI -Referral to counseling services for further evaluation and support.      Relevant Orders   Ambulatory referral to Behavioral Health   Other Visit Diagnoses     Urinary frequency    -  Primary UA with moderate leuks, protein, and  blood Sending for culture and micro. ABX pending culture unless symptoms worsen in the next 2 days, then will treat empirically while waiting for culture. Repeat in 2-3 weeks to ensure resolution    Relevant Orders   POCT urinalysis dipstick (Completed)   Urine Culture   Urine Microscopic Only       Return if symptoms worsen or fail to improve, for (pending lab results).   Clayborne Dana, NP

## 2023-05-28 NOTE — Patient Instructions (Signed)
Thank you for choosing [facility] for your Primary Care needs. I am excited for the opportunity to partner with you to meet your health care goals. It was a pleasure meeting you today!  Information on diet, exercise, and health maintenance recommendations are listed below. This is information to help you be sure you are on track for optimal health and monitoring.   Please look over this and let us know if you have any questions or if you have completed any of the health maintenance outside of [place] so that we can be sure your records are up to date.  ___________________________________________________________  MyChart:  For all urgent or time sensitive needs we ask that you please call the office to avoid delays. Our number is [phone]. MyChart is not constantly monitored and due to the large volume of messages a day, replies may take up to 72 business hours.  MyChart Policy: MyChart allows for you to see your visit notes, after visit summary, provider recommendations, lab and tests results, make an appointment, request refills, and contact your provider or the office for non-urgent questions or concerns. Providers are seeing patients during normal business hours and do not have built in time to review MyChart messages.  We ask that you allow a minimum of 3 business days for responses to MyChart messages. For this reason, please do not send urgent requests through MyChart. Please call the office at [phone]. New and ongoing conditions may require a visit. We have virtual and in-person visits available for your convenience.  Complex MyChart concerns may require a visit. Your provider may request you schedule a virtual or in-person visit to ensure we are providing the best care possible. MyChart messages sent after 11:00 AM on Friday will not be received by the provider until Monday morning.    Lab and Test Results: You will receive your lab and test  results on MyChart as soon as they are completed and results have been sent by the lab or testing facility. Due to this service, you will receive your results BEFORE your provider.  I review lab and test results each morning prior to seeing patients. Some results require collaboration with other providers to ensure you are receiving the most appropriate care. For this reason, we ask that you please allow a minimum of 3-5 business days from the time that ALL results have been received for your provider to receive and review lab and test results and contact you about these.  Most lab and test result comments from the provider will be sent through MyChart. Your provider may recommend changes to the plan of care, follow-up visits, repeat testing, ask questions, or request an office visit to discuss these results. You may reply directly to this message or call the office to provide information for the provider or set up an appointment. In some instances, you will be called with test results and recommendations. Please let us know if this is preferred and we will make note of this in your chart to provide this for you.    If you have not heard a response to your lab or test results in 5 business days from all results returning to MyChart, please call the office to let us know. We ask that you please avoid calling prior to this time unless there is an emergent concern. Due to high call volumes, this can delay the resulting process.  After Hours: For all non-emergency after hours needs, please   call the office at [phone] and select the option to reach the on-call  service. On-call services are shared between multiple [facility] offices and therefore it will not be possible to speak directly with your provider. On-call providers may provide medical advice and recommendations, but are unable to provide refills for maintenance medications.  For all emergency or urgent medical needs after normal business hours, we  recommend that you seek care at the closest Urgent Care or Emergency Department to ensure appropriate treatment in a timely manner.  [facility] has a 24 hour emergency room located on the ground floor for your convenience.   Urgent Concerns During the Business Day Providers are seeing patients from 8AM to 5PM with a busy schedule and are most often not able to respond to non-urgent calls until the end of the day or the next business day. If you should have URGENT concerns during the day, please call and speak to the nurse or schedule a same day appointment so that we can address your concern without delay.   Thank you, again, for choosing me as your health care partner. I appreciate your trust and look forward to learning more about you!   Darcey Demma B. Nhi Butrum, DNP, FNP-C  ___________________________________________________________  Health Maintenance Recommendations Screening Testing Mammogram Every 1-2 years based on history and risk factors Starting at age 50 Pap Smear Ages 21-39 every 3 years Ages 30-65 every 5 years with HPV testing More frequent testing may be required based on results and history Colon Cancer Screening Every 1-10 years based on test performed, risk factors, and history Starting at age 45 Bone Density Screening Every 2-10 years based on history Starting at age 65 for women Recommendations for men differ based on medication usage, history, and risk factors AAA Screening One time ultrasound Men 65-75 years old who have ever smoked Lung Cancer Screening Low Dose Lung CT every 12 months Age 50-80 years with a 20 pack-year smoking history who still smoke or who have quit within the last 15 years  Screening Labs Routine  Labs: Complete Blood Count (CBC), Complete Metabolic Panel (CMP), Cholesterol (Lipid Panel) Every 6-12 months based on history and medications May be recommended more frequently based on current conditions or previous results Hemoglobin  A1c Lab Every 3-12 months based on history and previous results Starting at age 45 or earlier with diagnosis of diabetes, high cholesterol, BMI >26, and/or risk factors Frequent monitoring for patients with diabetes to ensure blood sugar control Thyroid Panel  Every 6 months based on history, symptoms, and risk factors May be repeated more often if on medication HIV One time testing for all patients 13 and older May be repeated more frequently for patients with increased risk factors or exposure Hepatitis C One time testing for all patients 18 and older May be repeated more frequently for patients with increased risk factors or exposure Gonorrhea, Chlamydia Every 12 months for all sexually active persons 13-24 years Additional monitoring may be recommended for those who are considered high risk or who have symptoms PSA Men 40-54 years old with risk factors Additional screening may be recommended from age 55-69 based on risk factors, symptoms, and history  Vaccine Recommendations Tetanus Booster All adults every 10 years Flu Vaccine All patients 6 months and older every year COVID Vaccine All patients 12 years and older Initial dosing with booster May recommend additional booster based on age and health history HPV Vaccine 2 doses all patients age 9-26 Dosing may be considered   for patients over 26 Shingles Vaccine (Shingrix) 2 doses all adults 50 years and older Pneumonia (Pneumovax 23) All adults 65 years and older May recommend earlier dosing based on health history Pneumonia (Prevnar 13) All adults 65 years and older Dosed 1 year after Pneumovax 23 Pneumonia (Prevnar 20) All adults 65 years and older (adults 19-64 with certain conditions or risk factors) 1 dose  For those who have not received Prevnar 13 vaccine previously   Additional Screening, Testing, and Vaccinations may be recommended on an individualized basis based on family history, health history, risk  factors, and/or exposure.  __________________________________________________________  Diet Recommendations for All Patients  I recommend that all patients maintain a diet low in saturated fats, carbohydrates, and cholesterol. While this can be challenging at first, it is not impossible and small changes can make big differences.  Things to try: Decreasing the amount of soda, sweet tea, and/or juice to one or less per day and replace with water While water is always the first choice, if you do not like water you may consider adding a water additive without sugar to improve the taste other sugar free drinks Replace potatoes with a brightly colored vegetable  Use healthy oils, such as canola oil or olive oil, instead of butter or hard margarine Limit your bread intake to two pieces or less a day Replace regular pasta with low carb pasta options Bake, broil, or grill foods instead of frying Monitor portion sizes  Eat smaller, more frequent meals throughout the day instead of large meals  An important thing to remember is, if you love foods that are not great for your health, you don't have to give them up completely. Instead, allow these foods to be a reward when you have done well. Allowing yourself to still have special treats every once in a while is a nice way to tell yourself thank you for working hard to keep yourself healthy.   Also remember that every day is a new day. If you have a bad day and "fall off the wagon", you can still climb right back up and keep moving along on your journey!  We have resources available to help you!  Some websites that may be helpful include: www.MyPlate.gov  Www.VeryWellFit.com _____________________________________________________________  Activity Recommendations for All Patients  I recommend that all adults get at least 20 minutes of moderate physical activity that elevates your heart rate at least 5 days out of the week.  Some examples  include: Walking or jogging at a pace that allows you to carry on a conversation Cycling (stationary bike or outdoors) Water aerobics Yoga Weight lifting Dancing If physical limitations prevent you from putting stress on your joints, exercise in a pool or seated in a chair are excellent options.  Do determine your MAXIMUM heart rate for activity: 220 - YOUR AGE = MAX Heart Rate   Remember! Do not push yourself too hard.  Start slowly and build up your pace, speed, weight, time in exercise, etc.  Allow your body to rest between exercise and get good sleep. You will need more water than normal when you are exerting yourself. Do not wait until you are thirsty to drink. Drink with a purpose of getting in at least 8, 8 ounce glasses of water a day plus more depending on how much you exercise and sweat.    If you begin to develop dizziness, chest pain, abdominal pain, jaw pain, shortness of breath, headache, vision changes, lightheadedness, or other concerning symptoms,   stop the activity and allow your body to rest. If your symptoms are severe, seek emergency evaluation immediately. If your symptoms are concerning, but not severe, please let us know so that we can recommend further evaluation.     

## 2023-05-28 NOTE — Assessment & Plan Note (Signed)
Persistent cyst in the breast for approximately 2.5 years, possibly slightly increased in size. No associated pain or nipple discharge. Previous ultrasound in February 2022 confirmed cystic nature. -Order repeat breast ultrasound to reassess cyst. -Discuss potential referral for cyst removal, if desired by patient, after ultrasound results.

## 2023-05-29 ENCOUNTER — Encounter: Payer: Self-pay | Admitting: Family Medicine

## 2023-05-29 DIAGNOSIS — R350 Frequency of micturition: Secondary | ICD-10-CM

## 2023-05-29 LAB — CBC WITH DIFFERENTIAL/PLATELET
Basophils Absolute: 0.1 10*3/uL (ref 0.0–0.1)
Basophils Relative: 1.2 % (ref 0.0–3.0)
Eosinophils Absolute: 0.1 10*3/uL (ref 0.0–0.7)
Eosinophils Relative: 1.4 % (ref 0.0–5.0)
HCT: 35.8 % — ABNORMAL LOW (ref 36.0–46.0)
Hemoglobin: 11.3 g/dL — ABNORMAL LOW (ref 12.0–15.0)
Lymphocytes Relative: 37.3 % (ref 12.0–46.0)
Lymphs Abs: 3.6 10*3/uL (ref 0.7–4.0)
MCHC: 31.5 g/dL (ref 30.0–36.0)
MCV: 81.5 fL (ref 78.0–100.0)
Monocytes Absolute: 0.5 10*3/uL (ref 0.1–1.0)
Monocytes Relative: 5.6 % (ref 3.0–12.0)
Neutro Abs: 5.2 10*3/uL (ref 1.4–7.7)
Neutrophils Relative %: 54.5 % (ref 43.0–77.0)
Platelets: 257 10*3/uL (ref 150.0–400.0)
RBC: 4.39 Mil/uL (ref 3.87–5.11)
RDW: 17.1 % — ABNORMAL HIGH (ref 11.5–15.5)
WBC: 9.6 10*3/uL (ref 4.0–10.5)

## 2023-05-29 LAB — COMPREHENSIVE METABOLIC PANEL
ALT: 10 U/L (ref 0–35)
AST: 12 U/L (ref 0–37)
Albumin: 3.8 g/dL (ref 3.5–5.2)
Alkaline Phosphatase: 90 U/L (ref 39–117)
BUN: 7 mg/dL (ref 6–23)
CO2: 27 meq/L (ref 19–32)
Calcium: 8.9 mg/dL (ref 8.4–10.5)
Chloride: 102 meq/L (ref 96–112)
Creatinine, Ser: 0.74 mg/dL (ref 0.40–1.20)
GFR: 106.65 mL/min (ref >60.00)
Glucose, Bld: 77 mg/dL (ref 70–99)
Potassium: 4 meq/L (ref 3.5–5.1)
Sodium: 136 meq/L (ref 135–145)
Total Bilirubin: 0.4 mg/dL (ref 0.2–1.2)
Total Protein: 7.4 g/dL (ref 6.0–8.3)

## 2023-05-29 LAB — LIPID PANEL
Cholesterol: 167 mg/dL (ref 0–200)
HDL: 45.7 mg/dL (ref >39.00)
LDL Cholesterol: 96 mg/dL (ref 0–99)
NonHDL: 121.78
Total CHOL/HDL Ratio: 4
Triglycerides: 127 mg/dL (ref 0.0–149.0)
VLDL: 25.4 mg/dL (ref 0.0–40.0)

## 2023-05-29 LAB — HEMOGLOBIN A1C: Hgb A1c MFr Bld: 5.6 % (ref 4.6–6.5)

## 2023-05-29 LAB — URINALYSIS, MICROSCOPIC ONLY

## 2023-05-29 LAB — TSH: TSH: 4.64 u[IU]/mL (ref 0.35–5.50)

## 2023-05-29 MED ORDER — SULFAMETHOXAZOLE-TRIMETHOPRIM 800-160 MG PO TABS: 1.0000 | ORAL_TABLET | Freq: Two times a day (BID) | ORAL | 0 refills | Status: AC

## 2023-05-30 LAB — URINE CULTURE
MICRO NUMBER:: 15542376
SPECIMEN QUALITY:: ADEQUATE

## 2023-05-30 NOTE — Addendum Note (Signed)
Addended by: Hyman Hopes B on: 05/30/2023 05:14 PM   Modules accepted: Orders

## 2023-06-02 MED ORDER — CEPHALEXIN 500 MG PO CAPS
500.0000 mg | ORAL_CAPSULE | Freq: Two times a day (BID) | ORAL | 0 refills | Status: AC
Start: 2023-06-02 — End: 2023-06-09

## 2023-06-02 NOTE — Addendum Note (Signed)
Addended by: Hyman Hopes B on: 06/02/2023 11:52 AM   Modules accepted: Orders

## 2023-07-22 DIAGNOSIS — Z01419 Encounter for gynecological examination (general) (routine) without abnormal findings: Secondary | ICD-10-CM | POA: Diagnosis not present

## 2023-09-12 ENCOUNTER — Encounter (HOSPITAL_BASED_OUTPATIENT_CLINIC_OR_DEPARTMENT_OTHER): Payer: Self-pay | Admitting: Emergency Medicine

## 2023-09-12 ENCOUNTER — Other Ambulatory Visit: Payer: Self-pay

## 2023-09-12 DIAGNOSIS — M545 Low back pain, unspecified: Secondary | ICD-10-CM | POA: Insufficient documentation

## 2023-09-12 DIAGNOSIS — S3992XA Unspecified injury of lower back, initial encounter: Secondary | ICD-10-CM | POA: Diagnosis not present

## 2023-09-12 DIAGNOSIS — W000XXA Fall on same level due to ice and snow, initial encounter: Secondary | ICD-10-CM | POA: Diagnosis not present

## 2023-09-12 DIAGNOSIS — S39012A Strain of muscle, fascia and tendon of lower back, initial encounter: Secondary | ICD-10-CM | POA: Diagnosis not present

## 2023-09-12 NOTE — ED Triage Notes (Signed)
Pt reports slipping on black ice on Wed afternoon at work, c/o lower back pain

## 2023-09-13 ENCOUNTER — Emergency Department (HOSPITAL_BASED_OUTPATIENT_CLINIC_OR_DEPARTMENT_OTHER): Payer: BC Managed Care – PPO

## 2023-09-13 ENCOUNTER — Emergency Department (HOSPITAL_BASED_OUTPATIENT_CLINIC_OR_DEPARTMENT_OTHER)
Admission: EM | Admit: 2023-09-13 | Discharge: 2023-09-13 | Disposition: A | Discharge status: Home / Self Care | Payer: BC Managed Care – PPO | Attending: Emergency Medicine | Admitting: Emergency Medicine

## 2023-09-13 DIAGNOSIS — S3992XA Unspecified injury of lower back, initial encounter: Secondary | ICD-10-CM | POA: Diagnosis not present

## 2023-09-13 DIAGNOSIS — S39012A Strain of muscle, fascia and tendon of lower back, initial encounter: Secondary | ICD-10-CM

## 2023-09-13 DIAGNOSIS — M545 Low back pain, unspecified: Secondary | ICD-10-CM | POA: Diagnosis not present

## 2023-09-13 LAB — PREGNANCY, URINE: Preg Test, Ur: NEGATIVE

## 2023-09-13 MED ORDER — METHOCARBAMOL 500 MG PO TABS: 500.0000 mg | ORAL_TABLET | Freq: Three times a day (TID) | ORAL | 0 refills | Status: DC | PRN

## 2023-09-13 MED ORDER — NAPROXEN 250 MG PO TABS
500.0000 mg | ORAL_TABLET | Freq: Once | ORAL | Status: AC
  Administered 2023-09-13: 500 mg via ORAL
  Filled 2023-09-13: qty 2

## 2023-09-13 MED ORDER — METHOCARBAMOL 500 MG PO TABS
500.0000 mg | ORAL_TABLET | Freq: Once | ORAL | Status: AC
  Administered 2023-09-13: 500 mg via ORAL
  Filled 2023-09-13: qty 1

## 2023-09-13 MED ORDER — NAPROXEN 500 MG PO TABS: 500.0000 mg | ORAL_TABLET | Freq: Two times a day (BID) | ORAL | 0 refills | Status: DC | PRN

## 2023-09-13 NOTE — Discharge Instructions (Signed)
Testing is reassuring.  No fractures.  Take the anti-inflammatories and muscle relaxers as prescribed.  Return to the ED with new or worsening symptoms.

## 2023-09-13 NOTE — ED Provider Notes (Signed)
[facility] Provider Note   CSN: [id number] Arrival date & time: 09/12/23  2052     History  Chief Complaint  Patient presents with   Carrie Koch    Carrie Koch is a 33 y.o. female.  Patient with no medical history here with midline back pain after slipping on the ice 2 days ago.  States she was carrying bags when she slipped on the ice and fell backwards onto her buttock and right side.  Not hit head.  No loss of consciousness.  Complains of midline back pain that radiates to her right low back.  No radiation of the pain down her leg.  No focal weakness, numbness or tingling.  No bowel or bladder incontinence.  No fever or vomiting.  No chest pain or shortness of breath.  No history of IV drug abuse or cancer.  History of previous back problems.  Did not take any medication at home for pain.  Comes in today at the request of her employer.  The history is provided by the patient.  Fall Associated symptoms include chest pain. Pertinent negatives include no abdominal pain, no headaches and no shortness of breath.       Home Medications Prior to Admission medications   Not on File      Allergies    Patient has no known allergies.    Review of Systems   Review of Systems  Constitutional:  Negative for activity change, appetite change and fever.  HENT:  Negative for congestion and rhinorrhea.   Respiratory:  Negative for cough, chest tightness and shortness of breath.   Cardiovascular:  Positive for chest pain.  Gastrointestinal:  Positive for vomiting. Negative for abdominal pain and nausea.  Genitourinary:  Negative for dysuria and hematuria.  Musculoskeletal:  Positive for back pain.  Skin:  Negative for rash.  Neurological:  Negative for dizziness, weakness and headaches.   all other systems are negative except as noted in the HPI and PMH.    Physical Exam Updated Vital Signs BP (!) 121/90 (BP Location: Left Arm)   Pulse 91    Temp 98.4 F (36.9 C) (Oral)   Resp 16   Ht 5\' 4"  (1.626 m)   Wt (!) 145.2 kg   LMP 08/21/2023 (Exact Date)   SpO2 97%   BMI 54.93 kg/m  Physical Exam Vitals and nursing note reviewed.  Constitutional:      General: She is not in acute distress.    Appearance: She is well-developed.  HENT:     Head: Normocephalic and atraumatic.     Mouth/Throat:     Pharynx: No oropharyngeal exudate.  Eyes:     Conjunctiva/sclera: Conjunctivae normal.     Pupils: Pupils are equal, round, and reactive to light.  Neck:     Comments: No meningismus. Cardiovascular:     Rate and Rhythm: Normal rate and regular rhythm.     Heart sounds: Normal heart sounds. No murmur heard. Pulmonary:     Effort: Pulmonary effort is normal. No respiratory distress.     Breath sounds: Normal breath sounds.  Abdominal:     Palpations: Abdomen is soft.     Tenderness: There is no abdominal tenderness. There is no guarding or rebound.  Musculoskeletal:        General: Tenderness present. Normal range of motion.     Cervical back: Normal range of motion and neck supple.     Comments: Midline lumbar tenderness, no step-off  or deformity  5/5 strength in bilateral lower extremities. Ankle plantar and dorsiflexion intact. Great toe extension intact bilaterally. +2 DP and PT pulses. +2 patellar reflexes bilaterally. Normal gait.   Skin:    General: Skin is warm.  Neurological:     Mental Status: She is alert and oriented to person, place, and time.     Cranial Nerves: No cranial nerve deficit.     Motor: No abnormal muscle tone.     Coordination: Coordination normal.     Comments:  5/5 strength throughout. CN 2-12 intact.Equal grip strength.   Psychiatric:        Behavior: Behavior normal.     ED Results / Procedures / Treatments   Labs (all labs ordered are listed, but only abnormal results are displayed) Labs Reviewed - No data to display  EKG None  Radiology DG Lumbar Spine Complete Result Date:  09/13/2023 CLINICAL DATA:  Patient slipped and fell on ice 3 days ago. Pain across lower back radiating down the right leg. EXAM: LUMBAR SPINE - COMPLETE 4+ VIEW COMPARISON:  None Available. FINDINGS: No evidence of acute fracture or traumatic listhesis. Intervertebral disc space height is maintained. IMPRESSION: No evidence of acute fracture or traumatic listhesis. Electronically Signed   By: Minerva Fester M.D.   On: 09/13/2023 03:37    Procedures Procedures    Medications Ordered in ED Medications  methocarbamol (ROBAXIN) tablet 500 mg (has no administration in time range)  naproxen (NAPROSYN) tablet 500 mg (has no administration in time range)    ED Course/ Medical Decision Making/ A&P                                 Medical Decision Making Amount and/or Complexity of Data Reviewed Labs: ordered. Decision-making details documented in ED Course. Radiology: ordered and independent interpretation performed. Decision-making details documented in ED Course. ECG/medicine tests: ordered and independent interpretation performed. Decision-making details documented in ED Course.  Risk Prescription drug management.   Low back pain after falling on ice 2 days ago.  Neurovascularly intact.  Low suspicion for cord compression or cauda equina.  Will check screening x-ray as well as treat symptoms.  Lumbar x-rays negative for fracture or dislocation.  Suspect normal musculoskeletal soreness after fall.  No evidence of fracture.  No evidence of cord compression or cauda equina.  Treat supportively with anti-inflammatories and muscle relaxers.  Return to the ED with new or worsening symptoms.        Final Clinical Impression(s) / ED Diagnoses Final diagnoses:  None    Rx / DC Orders ED Discharge Orders     None         Alexandera Kuntzman, Jeannett Senior, MD 09/13/23 [phone]

## 2023-09-18 DIAGNOSIS — N898 Other specified noninflammatory disorders of vagina: Secondary | ICD-10-CM | POA: Diagnosis not present

## 2023-09-18 DIAGNOSIS — Z113 Encounter for screening for infections with a predominantly sexual mode of transmission: Secondary | ICD-10-CM | POA: Diagnosis not present

## 2023-09-18 DIAGNOSIS — Z124 Encounter for screening for malignant neoplasm of cervix: Secondary | ICD-10-CM | POA: Diagnosis not present

## 2023-10-27 ENCOUNTER — Telehealth: Admitting: Physician Assistant

## 2023-10-27 DIAGNOSIS — R3989 Other symptoms and signs involving the genitourinary system: Secondary | ICD-10-CM

## 2023-10-27 MED ORDER — NITROFURANTOIN MONOHYD MACRO 100 MG PO CAPS: 100.0000 mg | ORAL_CAPSULE | Freq: Two times a day (BID) | ORAL | 0 refills | Status: DC

## 2023-10-27 NOTE — Progress Notes (Signed)

## 2023-11-07 ENCOUNTER — Encounter: Payer: Self-pay | Admitting: Family Medicine

## 2023-12-17 ENCOUNTER — Telehealth: Payer: Self-pay | Admitting: Pharmacy Technician

## 2023-12-17 ENCOUNTER — Encounter: Payer: Self-pay | Admitting: Family Medicine

## 2023-12-17 ENCOUNTER — Other Ambulatory Visit (HOSPITAL_COMMUNITY): Payer: Self-pay

## 2023-12-17 ENCOUNTER — Ambulatory Visit: Admitting: Family Medicine

## 2023-12-17 VITALS — BP 112/64 | HR 76 | Ht 64.0 in | Wt 329.0 lb

## 2023-12-17 DIAGNOSIS — Z6841 Body Mass Index (BMI) 40.0 and over, adult: Secondary | ICD-10-CM | POA: Diagnosis not present

## 2023-12-17 DIAGNOSIS — E66813 Obesity, class 3: Secondary | ICD-10-CM

## 2023-12-17 MED ORDER — ZEPBOUND 2.5 MG/0.5ML ~~LOC~~ SOAJ: 2.5000 mg | SUBCUTANEOUS | 0 refills | Status: DC

## 2023-12-17 NOTE — Assessment & Plan Note (Signed)
 BMI 50.0-59.9. Weight gain of 6 pounds over 6 months. Difficulty losing weight despite lifestyle changes. Interested in GLP-1 receptor agonist therapy, specifically Zepbound . Explained Zepbound 's mechanism and side effects. Insurance may require prior authorization. - Update metabolic panel today - Educated on Zepbound  administration: weekly injection, potential side effects, and mitigation strategies. - Advise dietary modifications: smaller portions to prevent nausea and vomiting. - Encourage 150 minutes of physical activity per week, ideally in 30-minute sessions. - Recommend using a tape measure to track waist circumference

## 2023-12-17 NOTE — Progress Notes (Signed)
 Established Patient Office Visit  Subjective   Patient ID: Carrie Koch, female    DOB: 02-18-1991  Age: 33 y.o. MRN: [id number]  Chief Complaint  Patient presents with   Obesity    HPI  Discussed the use of AI scribe software for clinical note transcription with the patient, who gave verbal consent to proceed.  History of Present Illness She is a 33 year old female presenting for weight management consultation.  She has experienced a weight gain of six pounds over the past six months, reaching her heaviest weight of 329 pounds. Her weight was approximately 285 pounds in 2017 after having two children in quick succession. Despite attempts at lifestyle modifications, including regular gym workouts, dietary changes such as switching from fried to grilled or baked foods, reducing pork intake, and increasing fruit and vegetable consumption, she has not achieved significant weight loss.  Her current physical activity includes walking two to three days a week. She has not participated in structured weight loss programs.  She works at [employer] [place]. No family history of thyroid cancers or multiple endocrine neoplasia. No history of pancreatitis.      Wt Readings from Last 3 Encounters:  12/17/23 (!) 329 lb (149.2 kg)  09/12/23 (!) 320 lb (145.2 kg)  05/28/23 (!) 323 lb (146.5 kg)             ROS All review of systems negative except what is listed in the HPI    Objective:     BP 112/64   Pulse 76   Ht 5\' 4"  (1.626 m)   Wt (!) 329 lb (149.2 kg)   SpO2 99%   BMI 56.47 kg/m    Physical Exam Vitals reviewed.  Constitutional:      Appearance: Normal appearance. She is obese.  Cardiovascular:     Rate and Rhythm: Normal rate and regular rhythm.  Pulmonary:     Effort: Pulmonary effort is normal.     Breath sounds: Normal breath sounds.  Skin:    General: Skin is warm and dry.  Neurological:     Mental Status: She is alert and oriented to person, place,  and time.  Psychiatric:        Mood and Affect: Mood normal.        Behavior: Behavior normal.        Thought Content: Thought content normal.        Judgment: Judgment normal.          No results found for any visits on 12/17/23.    The ASCVD Risk score (Arnett DK, et al., 2019) failed to calculate for the following reasons:   The 2019 ASCVD risk score is only valid for ages 89 to 5    Assessment & Plan:   Problem List Items Addressed This Visit       Active Problems   Class 3 severe obesity without serious comorbidity with body mass index (BMI) of 50.0 to 59.9 in adult (HCC) - Primary   BMI 50.0-59.9. Weight gain of 6 pounds over 6 months. Difficulty losing weight despite lifestyle changes. Interested in GLP-1 receptor agonist therapy, specifically Zepbound . Explained Zepbound 's mechanism and side effects. Insurance may require prior authorization. - Update metabolic panel today - Educated on Zepbound  administration: weekly injection, potential side effects, and mitigation strategies. - Advise dietary modifications: smaller portions to prevent nausea and vomiting. - Encourage 150 minutes of physical activity per week, ideally in 30-minute sessions. - Recommend using a tape measure to track  waist circumference      Relevant Medications   tirzepatide  (ZEPBOUND ) 2.5 MG/0.5ML Pen   Other Relevant Orders   Comprehensive metabolic panel with GFR        Return in about 3 months (around 03/17/2024) for weight check .    Everlina Hock, NP

## 2023-12-17 NOTE — Telephone Encounter (Signed)
 Pharmacy Patient Advocate Encounter  Received notification from [facility] that Prior Authorization for ZEPBOUND  2.5MG /0.5ML has been APPROVED from 12/17/2023 to 08/13/2024. Ran test claim, Copay is $0.00. This test claim was processed through [facility]- copay amounts may vary at other pharmacies due to pharmacy/plan contracts, or as the patient moves through the different stages of their insurance plan.   PA #/Case ID/Reference #: [id number] SS

## 2023-12-17 NOTE — Telephone Encounter (Signed)
 Pharmacy Patient Advocate Encounter   Received notification from CoverMyMeds that prior authorization for Zepbound  2.5MG /0.5ML pen-injectors is required/requested.   Insurance verification completed.   The patient is insured through [facility] .   Per test claim: PA required; PA submitted to above mentioned insurance via CoverMyMeds Key/confirmation #/EOC [id number] Status is pending

## 2023-12-18 ENCOUNTER — Encounter: Payer: Self-pay | Admitting: Family Medicine

## 2023-12-18 LAB — COMPREHENSIVE METABOLIC PANEL WITH GFR
ALT: 10 U/L (ref 0–35)
AST: 13 U/L (ref 0–37)
Albumin: 4.1 g/dL (ref 3.5–5.2)
Alkaline Phosphatase: 100 U/L (ref 39–117)
BUN: 7 mg/dL (ref 6–23)
CO2: 26 meq/L (ref 19–32)
Calcium: 9.1 mg/dL (ref 8.4–10.5)
Chloride: 103 meq/L (ref 96–112)
Creatinine, Ser: 0.72 mg/dL (ref 0.40–1.20)
GFR: 109.79 mL/min (ref >60.00)
Glucose, Bld: 85 mg/dL (ref 70–99)
Potassium: 4 meq/L (ref 3.5–5.1)
Sodium: 137 meq/L (ref 135–145)
Total Bilirubin: 0.4 mg/dL (ref 0.2–1.2)
Total Protein: 7.7 g/dL (ref 6.0–8.3)

## 2024-01-12 ENCOUNTER — Other Ambulatory Visit: Payer: Self-pay | Admitting: Family

## 2024-01-12 ENCOUNTER — Encounter: Payer: Self-pay | Admitting: Family Medicine

## 2024-01-12 MED ORDER — TIRZEPATIDE-WEIGHT MANAGEMENT 5 MG/0.5ML ~~LOC~~ SOLN: 5.0000 mg | SUBCUTANEOUS | 1 refills | Status: DC

## 2024-01-12 NOTE — Telephone Encounter (Signed)
 Okay to increase Zepbound  dosage?

## 2024-01-13 MED ORDER — ZEPBOUND 5 MG/0.5ML ~~LOC~~ SOAJ: 5.0000 mg | SUBCUTANEOUS | 0 refills | Status: DC

## 2024-01-13 NOTE — Telephone Encounter (Signed)
 Copied from CRM [id number]. Topic: Clinical - Prescription Issue >> Jan 13, 2024 10:27 AM Baldo Levan wrote: Reason for CRM: [facility] pharmacy called in stating that a prescription was sent over for  tirzepatide  5 MG/0.5ML injection vial [[id number]]. They are stating they can only fill this as Zepbound  due to insurance. [facility] is requesting a call back or a new prescription be sent over. Call back information is [phone] Hilarie Lovely.

## 2024-01-13 NOTE — Telephone Encounter (Signed)
 Sent a new RX for brand name pen.

## 2024-01-14 DIAGNOSIS — S8001XA Contusion of right knee, initial encounter: Secondary | ICD-10-CM | POA: Diagnosis not present

## 2024-01-23 DIAGNOSIS — S83241A Other tear of medial meniscus, current injury, right knee, initial encounter: Secondary | ICD-10-CM | POA: Diagnosis not present

## 2024-01-23 DIAGNOSIS — M25561 Pain in right knee: Secondary | ICD-10-CM | POA: Diagnosis not present

## 2024-02-11 ENCOUNTER — Ambulatory Visit: Admitting: Family Medicine

## 2024-02-11 VITALS — BP 105/69 | HR 80 | Ht 64.0 in | Wt 315.0 lb

## 2024-02-11 DIAGNOSIS — Z6841 Body Mass Index (BMI) 40.0 and over, adult: Secondary | ICD-10-CM | POA: Diagnosis not present

## 2024-02-11 DIAGNOSIS — E66813 Obesity, class 3: Secondary | ICD-10-CM

## 2024-02-11 MED ORDER — ONDANSETRON 4 MG PO TBDP: 4.0000 mg | ORAL_TABLET | Freq: Three times a day (TID) | ORAL | 0 refills | Status: AC | PRN

## 2024-02-11 MED ORDER — ZEPBOUND 7.5 MG/0.5ML ~~LOC~~ SOAJ: 7.5000 mg | SUBCUTANEOUS | 1 refills | Status: DC

## 2024-02-11 NOTE — Progress Notes (Signed)
   Established Patient Office Visit  Subjective   Patient ID: Carrie Koch, female    DOB: 03-22-1991  Age: 33 y.o. MRN: [id number]  Chief Complaint  Patient presents with   Medical Management of Chronic Issues    HPI   Patient is here for weight check and obesity management.  Obesity: - Weight history: Heaviest at 329 lbs (2025), initial significant weight gain after pregnancies (285 lbs in 2017). She has tried multiple weight loss  methods over the years including regular gym workouts, low fat/carb diets, portion control.  - Current medication management: Zepbound  5mg /week - some nausea, but tolerating well overall  - Current Diet/Nutrition: portion control - Current Exercise: walking most days      ROS All review of systems negative except what is listed in the HPI    Objective:     BP 105/69   Pulse 80   Ht 5' 4 (1.626 m)   Wt (!) 315 lb (142.9 kg)   SpO2 100%   BMI 54.07 kg/m     Physical Exam Vitals reviewed.  Constitutional:      Appearance: Normal appearance. She is obese.   Cardiovascular:     Rate and Rhythm: Normal rate and regular rhythm.  Pulmonary:     Effort: Pulmonary effort is normal.     Breath sounds: Normal breath sounds.   Skin:    General: Skin is warm and dry.   Neurological:     Mental Status: She is alert and oriented to person, place, and time.   Psychiatric:        Mood and Affect: Mood normal.        Behavior: Behavior normal.        Thought Content: Thought content normal.        Judgment: Judgment normal.        No results found for any visits on 02/11/24.    The ASCVD Risk score (Arnett DK, et al., 2019) failed to calculate for the following reasons:   The 2019 ASCVD risk score is only valid for ages 47 to 14    Assessment & Plan:   Problem List Items Addressed This Visit       Active Problems   Class 3 severe obesity without serious comorbidity with body mass index (BMI) of 50.0 to 59.9 in adult -  Primary   She lost 14 pounds on Tirzepatide  5 mg but feels like it is wearing off more quickly now. Occasional nausea is manageable with portion control.  - Increase Tirzepatide  to 7.5 mg. - Prescribe PRN Zofran for nausea. - Follow-up weight check in three months.      Relevant Medications   ondansetron (ZOFRAN-ODT) 4 MG disintegrating tablet   tirzepatide  (ZEPBOUND ) 7.5 MG/0.5ML Pen        Return in about 3 months (around 05/13/2024) for weight check .    Everlina Hock, NP

## 2024-02-11 NOTE — Assessment & Plan Note (Signed)
 She lost 14 pounds on Tirzepatide  5 mg but feels like it is wearing off more quickly now. Occasional nausea is manageable with portion control.  - Increase Tirzepatide  to 7.5 mg. - Prescribe PRN Zofran for nausea. - Follow-up weight check in three months.

## 2024-02-20 ENCOUNTER — Telehealth

## 2024-02-20 DIAGNOSIS — R399 Unspecified symptoms and signs involving the genitourinary system: Secondary | ICD-10-CM | POA: Diagnosis not present

## 2024-02-21 MED ORDER — NITROFURANTOIN MONOHYD MACRO 100 MG PO CAPS: 100.0000 mg | ORAL_CAPSULE | Freq: Two times a day (BID) | ORAL | 0 refills | Status: AC

## 2024-02-21 NOTE — Progress Notes (Signed)

## 2024-02-21 NOTE — Progress Notes (Signed)
 I have spent 5 minutes in review of e-visit questionnaire, review and updating patient chart, medical decision making and response to patient.   Claiborne Rigg, NP

## 2024-03-17 ENCOUNTER — Encounter: Payer: Self-pay | Admitting: Family Medicine

## 2024-03-17 ENCOUNTER — Ambulatory Visit: Admitting: Family Medicine

## 2024-03-17 VITALS — BP 128/64 | HR 77 | Temp 98.1°F | Resp 18 | Ht 64.0 in | Wt 302.0 lb

## 2024-03-17 DIAGNOSIS — E66813 Obesity, class 3: Secondary | ICD-10-CM | POA: Diagnosis not present

## 2024-03-17 DIAGNOSIS — N926 Irregular menstruation, unspecified: Secondary | ICD-10-CM

## 2024-03-17 DIAGNOSIS — R399 Unspecified symptoms and signs involving the genitourinary system: Secondary | ICD-10-CM | POA: Diagnosis not present

## 2024-03-17 DIAGNOSIS — Z6841 Body Mass Index (BMI) 40.0 and over, adult: Secondary | ICD-10-CM | POA: Diagnosis not present

## 2024-03-17 LAB — POC URINALSYSI DIPSTICK (AUTOMATED)
Bilirubin, UA: NEGATIVE
Blood, UA: NEGATIVE
Glucose, UA: NEGATIVE
Ketones, UA: NEGATIVE
Nitrite, UA: POSITIVE
Protein, UA: NEGATIVE
Spec Grav, UA: 1.015 (ref 1.010–1.025)
Urobilinogen, UA: 0.2 U/dL
pH, UA: 6 (ref 5.0–8.0)

## 2024-03-17 LAB — POCT URINE PREGNANCY: Preg Test, Ur: NEGATIVE

## 2024-03-17 MED ORDER — SULFAMETHOXAZOLE-TRIMETHOPRIM 800-160 MG PO TABS: 1.0000 | ORAL_TABLET | Freq: Two times a day (BID) | ORAL | 0 refills | Status: AC

## 2024-03-17 NOTE — Assessment & Plan Note (Signed)
 Lost 13 pounds since last visit! Tirzepatide  effective for weight loss. - Continue Tirzepatide  7.5 MG/0.5ML subcutaneous weekly. - Advise contraception/condom use while on Tirzepatide . - Continue healthy diet, portion control, and exercise.

## 2024-03-17 NOTE — Progress Notes (Signed)
 Established Patient Office Visit  Subjective   Patient ID: Carrie Koch, female    DOB: [birth year]  Age: 33 y.o. MRN: [id number]  Chief Complaint  Patient presents with   Follow-up    3 month    HPI   Patient is here for weight check and obesity management. She would also like to mention some dark urine. She was recently treated for UTI and has no other symptoms at this time. She also missed her June period, but reports she is following with GYN and instructed to notify them if 3 months or more without a period. She is sexually active, not on birth control.   Obesity: - Weight history: Heaviest at 329 lbs (2025), initial significant weight gain after pregnancies (285 lbs in 2017). She has tried multiple weight loss  methods over the years including regular gym workouts, low fat/carb diets, portion control.  - Current medication management: Zepbound  7.5 mg/week - tolerating well overall  - Current Diet/Nutrition: portion control - Current Exercise: walking most days   Wt Readings from Last 3 Encounters:  03/17/24 (!) 302 lb (137 kg)  02/11/24 (!) 315 lb (142.9 kg)  12/17/23 (!) 329 lb (149.2 kg)       ROS All review of systems negative except what is listed in the HPI    Objective:     BP 128/64   Pulse 77   Temp 98.1 F (36.7 C)   Resp 18   Ht 5' 4 (1.626 m)   Wt (!) 302 lb (137 kg)   SpO2 100%   BMI 51.84 kg/m     Physical Exam Vitals reviewed.  Constitutional:      Appearance: Normal appearance. She is obese.  Cardiovascular:     Rate and Rhythm: Normal rate and regular rhythm.  Pulmonary:     Effort: Pulmonary effort is normal.     Breath sounds: Normal breath sounds.  Skin:    General: Skin is warm and dry.  Neurological:     Mental Status: She is alert and oriented to person, place, and time.  Psychiatric:        Mood and Affect: Mood normal.        Behavior: Behavior normal.        Thought Content: Thought content normal.         Judgment: Judgment normal.        Results for orders placed or performed in visit on 03/17/24  POCT Urinalysis Dipstick (Automated)  Result Value Ref Range   Color, UA dark yellow    Clarity, UA cloudy    Glucose, UA Negative Negative   Bilirubin, UA negative    Ketones, UA negative    Spec Grav, UA 1.015 1.010 - 1.025   Blood, UA negative    pH, UA 6.0 5.0 - 8.0   Protein, UA Negative Negative   Urobilinogen, UA 0.2 0.2 or 1.0 E.U./dL   Nitrite, UA positive    Leukocytes, UA Small (1+) (A) Negative  POCT urine pregnancy  Result Value Ref Range   Preg Test, Ur Negative Negative      The ASCVD Risk score (Arnett DK, et al., 2019) failed to calculate for the following reasons:   The 2019 ASCVD risk score is only valid for ages 53 to 71    Assessment & Plan:   Problem List Items Addressed This Visit       Active Problems   Class 3 severe obesity without serious comorbidity with body  mass index (BMI) of 50.0 to 59.9 in adult   Lost 13 pounds since last visit! Tirzepatide  effective for weight loss. - Continue Tirzepatide  7.5 MG/0.5ML subcutaneous weekly. - Advise contraception/condom use while on Tirzepatide . - Continue healthy diet, portion control, and exercise.       Other Visit Diagnoses       UTI symptoms    -  Primary UA suggestive of UTI. Will start Bactrim  and send culture. Stay well hydrated.    Relevant Medications   sulfamethoxazole -trimethoprim  (BACTRIM  DS) 800-160 MG tablet   Other Relevant Orders   POCT Urinalysis Dipstick (Automated) (Completed)   Urine Culture     Missed menses     Negative pregnancy test. Following with GYN for irregular cycles.    Relevant Orders   POCT urine pregnancy (Completed)         Return in about 3 months (around 06/17/2024) for chronic disease management.    Waddell KATHEE Mon, NP

## 2024-03-19 ENCOUNTER — Ambulatory Visit: Payer: Self-pay | Admitting: Family Medicine

## 2024-03-19 LAB — URINE CULTURE
MICRO NUMBER:: 16735756
SPECIMEN QUALITY:: ADEQUATE

## 2024-03-26 ENCOUNTER — Encounter: Payer: Self-pay | Admitting: Family Medicine

## 2024-03-29 ENCOUNTER — Other Ambulatory Visit: Payer: Self-pay | Admitting: Family

## 2024-03-29 DIAGNOSIS — E66813 Obesity, class 3: Secondary | ICD-10-CM

## 2024-03-29 MED ORDER — ZEPBOUND 7.5 MG/0.5ML ~~LOC~~ SOAJ
7.5000 mg | SUBCUTANEOUS | 1 refills | Status: DC
Start: 2024-03-29 — End: 2024-03-29

## 2024-03-29 MED ORDER — TIRZEPATIDE-WEIGHT MANAGEMENT 10 MG/0.5ML ~~LOC~~ SOLN
10.0000 mg | SUBCUTANEOUS | 1 refills | Status: DC
Start: 2024-03-29 — End: 2024-03-31

## 2024-03-29 NOTE — Telephone Encounter (Signed)
 Seen on 03/17/2024 and wrote the following:   Active Problems     Class 3 severe obesity without serious comorbidity with body mass index (BMI) of 50.0 to 59.9 in adult    Lost 13 pounds since last visit! Tirzepatide  effective for weight loss. - Continue Tirzepatide  7.5 MG/0.5ML subcutaneous weekly. - Advise contraception/condom use while on Tirzepatide . - Continue healthy diet, portion control, and exercise.      Do you want to increase to next dosage?

## 2024-03-29 NOTE — Telephone Encounter (Signed)
 Patient is asking for an increased dose. Please review.

## 2024-03-31 ENCOUNTER — Telehealth: Payer: Self-pay

## 2024-03-31 ENCOUNTER — Other Ambulatory Visit (HOSPITAL_COMMUNITY): Payer: Self-pay

## 2024-03-31 ENCOUNTER — Other Ambulatory Visit: Payer: Self-pay | Admitting: Family

## 2024-03-31 MED ORDER — WEGOVY 1 MG/0.5ML ~~LOC~~ SOAJ: 1.0000 mg | SUBCUTANEOUS | 1 refills | Status: DC

## 2024-03-31 NOTE — Telephone Encounter (Signed)
 Mychart message sent to patient.

## 2024-03-31 NOTE — Telephone Encounter (Signed)
 Please advise if switch to Wegovy  okay

## 2024-03-31 NOTE — Telephone Encounter (Signed)
 Pharmacy Patient Advocate Encounter   Received notification from Onbase that prior authorization for Zepbound  7.5 is required/requested.   Insurance verification completed.   The patient is insured through [facility] ADVANTAGE/RX ADVANCE .   Per test claim:  Wegovy   is preferred by the insurance.  If suggested medication is appropriate, Please send in a new RX and discontinue this one. If not, please advise as to why it's not appropriate so that we may request a Prior Authorization. Please note, some preferred medications may still require a PA.  If the suggested medications have not been trialed and there are no contraindications to their use, the PA will not be submitted, as it will not be approved.  Zepbound  now has a plan benefit exclusion. Patient insurance transferred PA approval to Wegovy . Ran test claim for Wegovy  and co-pay is $0.00

## 2024-06-29 DIAGNOSIS — N938 Other specified abnormal uterine and vaginal bleeding: Secondary | ICD-10-CM | POA: Diagnosis not present

## 2024-07-13 ENCOUNTER — Encounter: Payer: Self-pay | Admitting: Family Medicine

## 2024-07-14 ENCOUNTER — Other Ambulatory Visit (HOSPITAL_COMMUNITY): Payer: Self-pay

## 2024-07-14 ENCOUNTER — Telehealth: Payer: Self-pay

## 2024-07-14 DIAGNOSIS — E66813 Obesity, class 3: Secondary | ICD-10-CM

## 2024-07-14 NOTE — Telephone Encounter (Signed)
 Pharmacy Patient Advocate Encounter   Received notification from Onbase that prior authorization for Zepbound  7.5mg /0.14ml is required/requested.   Insurance verification completed.   The patient is insured through [facility].   Per test claim:  Wegovy  is preferred by the insurance.  If suggested medication is appropriate, Please send in a new RX and discontinue this one. If not, please advise as to why it's not appropriate so that we may request a Prior Authorization. Please note, some preferred medications may still require a PA.  If the suggested medications have not been trialed and there are no contraindications to their use, the PA will not be submitted, as it will not be approved.   Updated chart notes will be needed in order to submit the PA.  Will also need why the patient cannot take Wegovy .   CMM Key: [id number]

## 2024-07-15 ENCOUNTER — Ambulatory Visit: Admitting: Family Medicine

## 2024-07-15 NOTE — Telephone Encounter (Signed)
 Has appt today

## 2024-07-15 NOTE — Progress Notes (Incomplete)
 Established Patient Office Visit  Subjective:  Patient ID: Carrie Koch, female    DOB: 1991/05/11  Age: 33 y.o. MRN: [id number]  CC: No chief complaint on file.     HPI Carrie Koch is here for Weight Loss Management.     Obesity: - Weight history: Heaviest at 329 lbs (2025), initial significant weight gain after pregnancies (285 lbs in 2017). She has tried multiple weight loss  methods over the years including regular gym workouts, low fat/carb diets, portion control.  - Current medication management: Wegovy  1mg /week - some nausea, but tolerating well overall  - Current Diet/Nutrition: portion control - Current Exercise: walking most days  Wt Readings from Last 3 Encounters:  03/17/24 (!) 302 lb (137 kg)  02/11/24 (!) 315 lb (142.9 kg)  12/17/23 (!) 329 lb (149.2 kg)      No past medical history on file.  No past surgical history on file.  Family History  Problem Relation Age of Onset   Healthy Mother    Healthy Father    Heart failure Maternal Grandmother     Social History   Socioeconomic History   Marital status: Single    Spouse name: Not on file   Number of children: Not on file   Years of education: Not on file   Highest education level: Associate degree: academic program  Occupational History   Not on file  Tobacco Use   Smoking status: Never   Smokeless tobacco: Never  Vaping Use   Vaping status: Never Used  Substance and Sexual Activity   Alcohol use: Yes    Comment: occasional   Drug use: No   Sexual activity: Yes    Partners: Male    Birth control/protection: None  Other Topics Concern   Not on file  Social History Narrative   Not on file   Social Drivers of Health   Financial Resource Strain: Low Risk  (02/11/2024)   Overall Financial Resource Strain (CARDIA)    Difficulty of Paying Living Expenses: Not very hard  Food Insecurity: No Food Insecurity (02/11/2024)   Hunger Vital Sign    Worried About Running Out of Food in the  Last Year: Never true    Ran Out of Food in the Last Year: Never true  Transportation Needs: No Transportation Needs (02/11/2024)   PRAPARE - Administrator, Civil Service (Medical): No    Lack of Transportation (Non-Medical): No  Physical Activity: Insufficiently Active (02/11/2024)   Exercise Vital Sign    Days of Exercise per Week: 3 days    Minutes of Exercise per Session: 30 min  Stress: Stress Concern Present (02/11/2024)   [employer] of Occupational Health - Occupational Stress Questionnaire    Feeling of Stress: To some extent  Social Connections: Socially Isolated (02/11/2024)   Social Connection and Isolation Panel    Frequency of Communication with Friends and Family: Twice a week    Frequency of Social Gatherings with Friends and Family: Once a week    Attends Religious Services: Never    Database Administrator or Organizations: No    Attends Engineer, Structural: Not on file    Marital Status: Never married  Intimate Partner Violence: Not on file    ROS All ROS negative except what is listed in the HPI.   Objective:   Today's Vitals: There were no vitals taken for this visit.  Physical Exam  Assessment & Plan:   Problem List Items Addressed This  Visit   None     Follow-up: No follow-ups on file.   Waddell FURY Almarie, DNP, FNP-C  I,Emily Lagle,acting as a neurosurgeon for Waddell KATHEE Almarie, NP.,have documented all relevant documentation on the behalf of Waddell KATHEE Almarie, NP.  I, Waddell KATHEE Almarie, NP, have reviewed all documentation for this visit. The documentation on 07/15/2024 for the exam, diagnosis, procedures, and orders are all accurate and complete.

## 2024-07-15 NOTE — Telephone Encounter (Signed)
 Ask patient to schedule appointment so we can have updated weight and note documenting she couldn't tolerate the Wegovy  to see if they will make an exception for Zepbound .

## 2024-07-16 ENCOUNTER — Ambulatory Visit: Admitting: Family Medicine

## 2024-07-16 DIAGNOSIS — G4739 Other sleep apnea: Secondary | ICD-10-CM | POA: Diagnosis not present

## 2024-07-16 DIAGNOSIS — R8290 Unspecified abnormal findings in urine: Secondary | ICD-10-CM | POA: Diagnosis not present

## 2024-07-16 LAB — HEMOGLOBIN A1C: Hgb A1c MFr Bld: 5.2 % (ref 4.6–6.5)

## 2024-07-16 NOTE — Telephone Encounter (Signed)
 Yes, she had IBS symptoms with Wegovy , we had a visit today to have a note stating that to try to get Zepbound  covered. Thanks!

## 2024-07-16 NOTE — Assessment & Plan Note (Signed)
 Previously on Wegovy , stopped due to gastrointestinal side effects. Zepbound  tolerated well prior to Wegovy  trial but insurance stopped covering. Discussed potential benefits for weight management and suspected sleep apnea. - Encouraged increased protein intake to prevent muscle mass loss. - Referred to pulmonology for sleep study to evaluate for obstructive sleep apnea.

## 2024-07-16 NOTE — Addendum Note (Signed)
 Addended by: TRUDY CURVIN RAMAN on: 07/16/2024 02:10 PM   Modules accepted: Orders

## 2024-07-16 NOTE — Progress Notes (Signed)
 Established Patient Office Visit Subjective:  Patient ID: Carrie Koch, female    DOB: Jun 11, 1991  Age: 33 y.o. MRN: [id number]  CC:  Chief Complaint  Patient presents with   Obesity      HPI Carrie Koch is here for Weight Loss Management.   Discussed the use of AI scribe software for clinical note transcription with the patient, who gave verbal consent to proceed.  History of Present Illness Carrie Koch is a 33 year old female who presents with concerns about obesity, sleep apnea and urinary symptoms.  She is concerned about potential sleep apnea. Her mother has observed snoring, and she has experienced waking up gasping for air on several occasions. She also reports daytime hypersomnia. She has not undergone a sleep study previously.  She discontinued Wegovy  in August due to severe side effects including diarrhea, extreme vomiting, and nausea, which she found intolerable. She was previously doing well on Zepbound , but insurance was no longer covering.   She reports noticing an odor in her urine. No urgency, frequency, burning, or pain. She has been drinking cranberry juice and water. No current fever or painr.       Obesity: - Weight history: Heaviest at 329 lbs (2025), initial significant weight gain after pregnancies (285 lbs in 2017). She has tried multiple weight loss methods over the years including regular gym workouts, low fat/carb diets, portion control.  - Current medication management: Wegovy  1mg /week - had stop due to severe GI symptoms - Current Diet/Nutrition: portion control - Current Exercise: walking most days      Wt Readings from Last 3 Encounters:  07/16/24 (!) 302 lb (137 kg)  03/17/24 (!) 302 lb (137 kg)  02/11/24 (!) 315 lb (142.9 kg)      History reviewed. No pertinent past medical history.  History reviewed. No pertinent surgical history.  Family History  Problem Relation Age of Onset   Healthy Mother    Healthy Father     Heart failure Maternal Grandmother     Social History   Socioeconomic History   Marital status: Single    Spouse name: Not on file   Number of children: Not on file   Years of education: Not on file   Highest education level: Associate degree: academic program  Occupational History   Not on file  Tobacco Use   Smoking status: Never   Smokeless tobacco: Never  Vaping Use   Vaping status: Never Used  Substance and Sexual Activity   Alcohol use: Yes    Comment: occasional   Drug use: No   Sexual activity: Yes    Partners: Male    Birth control/protection: None  Other Topics Concern   Not on file  Social History Narrative   Not on file   Social Drivers of Health   Financial Resource Strain: Low Risk  (07/16/2024)   Overall Financial Resource Strain (CARDIA)    Difficulty of Paying Living Expenses: Not very hard  Food Insecurity: No Food Insecurity (07/16/2024)   Hunger Vital Sign    Worried About Running Out of Food in the Last Year: Never true    Ran Out of Food in the Last Year: Never true  Transportation Needs: No Transportation Needs (07/16/2024)   PRAPARE - Administrator, Civil Service (Medical): No    Lack of Transportation (Non-Medical): No  Physical Activity: Insufficiently Active (07/16/2024)   Exercise Vital Sign    Days of Exercise per Week: 4 days  Minutes of Exercise per Session: 20 min  Stress: Stress Concern Present (07/16/2024)   [employer] of Occupational Health - Occupational Stress Questionnaire    Feeling of Stress: To some extent  Social Connections: Unknown (07/16/2024)   Social Connection and Isolation Panel    Frequency of Communication with Friends and Family: Twice a week    Frequency of Social Gatherings with Friends and Family: Once a week    Attends Religious Services: Never    Database Administrator or Organizations: No    Attends Engineer, Structural: Not on file    Marital Status: Patient declined   Intimate Partner Violence: Not on file    ROS All ROS negative except what is listed in the HPI.   Objective:   Today's Vitals: BP 139/79   Pulse 85   Ht 5' 4 (1.626 m)   Wt (!) 302 lb (137 kg)   SpO2 100%   BMI 51.84 kg/m   Physical Exam Vitals reviewed.  Constitutional:      Appearance: Normal appearance. She is obese.  Cardiovascular:     Rate and Rhythm: Normal rate and regular rhythm.  Pulmonary:     Effort: Pulmonary effort is normal.     Breath sounds: Normal breath sounds.  Skin:    General: Skin is warm and dry.  Neurological:     Mental Status: She is alert and oriented to person, place, and time.  Psychiatric:        Mood and Affect: Mood normal.        Behavior: Behavior normal.        Thought Content: Thought content normal.        Judgment: Judgment normal.           Assessment & Plan:   Problem List Items Addressed This Visit       Active Problems   Morbid obesity (HCC) - Primary   Previously on Wegovy , stopped due to gastrointestinal side effects. Zepbound  tolerated well prior to Wegovy  trial but insurance stopped covering. Discussed potential benefits for weight management and suspected sleep apnea. - Encouraged increased protein intake to prevent muscle mass loss. - Referred to pulmonology for sleep study to evaluate for obstructive sleep apnea.      Relevant Orders   Comprehensive metabolic panel with GFR   TSH   Hemoglobin A1c   Other Visit Diagnoses       Abnormal urine odor     Reports urinary odor without other UTI symptoms.  - Check urine   Relevant Orders   Urinalysis w microscopic + reflex cultur     Sleep apnea-like behavior     Symptoms include snoring, waking up gasping, and daytime hypersomnia. No prior sleep study conducted. Discussed potential for Zepbound  to address sleep apnea if confirmed. - Referred to pulmonology for sleep study to confirm obstructive sleep apnea.   Relevant Orders   Ambulatory referral to  Pulmonology       Follow-up: Return in about 3 months (around 10/16/2024) for physical.   Waddell B. Almarie, DNP, FNP-C  I,Emily Lagle,acting as a neurosurgeon for Waddell KATHEE Almarie, NP.,have documented all relevant documentation on the behalf of Waddell KATHEE Almarie, NP.  I, Waddell KATHEE Almarie, NP, have reviewed all documentation for this visit. The documentation on 07/16/2024 for the exam, diagnosis, procedures, and orders are all accurate and complete.

## 2024-07-16 NOTE — Telephone Encounter (Signed)
 Is the patient going to be on Zepbound ?  Didn't see it in her chart and wanted to make sure before I submitted the prior authorization.   Please advise.

## 2024-07-17 LAB — COMPREHENSIVE METABOLIC PANEL WITH GFR
AG Ratio: 1.1 (calc) (ref 1.0–2.5)
ALT: 11 U/L (ref 6–29)
AST: 13 U/L (ref 10–30)
Albumin: 3.6 g/dL (ref 3.6–5.1)
Alkaline phosphatase (APISO): 83 U/L (ref 31–125)
BUN: 9 mg/dL (ref 7–25)
CO2: 26 mmol/L (ref 20–32)
Calcium: 9 mg/dL (ref 8.6–10.2)
Chloride: 106 mmol/L (ref 98–110)
Creat: 0.75 mg/dL (ref 0.50–0.97)
Globulin: 3.3 g/dL (ref 1.9–3.7)
Glucose, Bld: 83 mg/dL (ref 65–99)
Potassium: 4.2 mmol/L (ref 3.5–5.3)
Sodium: 139 mmol/L (ref 135–146)
Total Bilirubin: 0.3 mg/dL (ref 0.2–1.2)
Total Protein: 6.9 g/dL (ref 6.1–8.1)
eGFR: 107 mL/min/1.73m2 (ref >=60)

## 2024-07-17 LAB — TSH: TSH: 4.01 m[IU]/L

## 2024-07-19 ENCOUNTER — Encounter: Payer: Self-pay | Admitting: Family Medicine

## 2024-07-19 ENCOUNTER — Ambulatory Visit: Admitting: Family Medicine

## 2024-07-19 ENCOUNTER — Other Ambulatory Visit (HOSPITAL_COMMUNITY): Payer: Self-pay

## 2024-07-19 ENCOUNTER — Ambulatory Visit: Payer: Self-pay | Admitting: Family Medicine

## 2024-07-19 DIAGNOSIS — N390 Urinary tract infection, site not specified: Secondary | ICD-10-CM

## 2024-07-19 LAB — URINE CULTURE
MICRO NUMBER:: 17271247
SPECIMEN QUALITY:: ADEQUATE

## 2024-07-19 LAB — URINALYSIS W MICROSCOPIC + REFLEX CULTURE
Bilirubin Urine: NEGATIVE
Glucose, UA: NEGATIVE
Hgb urine dipstick: NEGATIVE
Hyaline Cast: NONE SEEN /LPF
Ketones, ur: NEGATIVE
Nitrites, Initial: POSITIVE — AB
RBC / HPF: NONE SEEN /HPF (ref 0–2)
Specific Gravity, Urine: 1.025 (ref 1.001–1.035)
pH: 8 (ref 5.0–8.0)

## 2024-07-19 LAB — CULTURE INDICATED

## 2024-07-19 MED ORDER — SULFAMETHOXAZOLE-TRIMETHOPRIM 800-160 MG PO TABS: 1.0000 | ORAL_TABLET | Freq: Two times a day (BID) | ORAL | 0 refills | Status: AC

## 2024-07-19 NOTE — Telephone Encounter (Signed)
 Culture + for e-coli.

## 2024-07-19 NOTE — Telephone Encounter (Signed)
 Clinical questions answered and PA submitted.

## 2024-07-19 NOTE — Telephone Encounter (Signed)
 Pharmacy Patient Advocate Encounter  Received notification from [facility] that Prior Authorization for Mounjaro  7.5mg /0.26ml has been APPROVED from 07/19/24 to 07/19/25   PA #/Case ID/Reference #: [id number]  Zebpound is not covered by the plan but the insurance did approve the Mounjaro  for the patient.     Please send a new order for Mounjaro  to the pharmacy for the patient. Thanks.

## 2024-07-20 ENCOUNTER — Other Ambulatory Visit (HOSPITAL_COMMUNITY): Payer: Self-pay

## 2024-07-20 MED ORDER — TIRZEPATIDE 2.5 MG/0.5ML ~~LOC~~ SOAJ: 2.5000 mg | SUBCUTANEOUS | 1 refills | Status: AC

## 2024-07-20 NOTE — Telephone Encounter (Signed)
 Carrie Koch,   Waddell never wrote for Mounjaro , can we look into how this got sent for PA? Also a high dosage, so we are confused. Thanks!

## 2024-07-20 NOTE — Addendum Note (Signed)
 Addended by: ALMARIE BIRMINGHAM B on: 07/20/2024 01:37 PM   Modules accepted: Orders

## 2024-07-21 ENCOUNTER — Ambulatory Visit: Admitting: Family Medicine

## 2024-08-09 DIAGNOSIS — F4322 Adjustment disorder with anxiety: Secondary | ICD-10-CM | POA: Diagnosis not present

## 2024-08-09 DIAGNOSIS — F4310 Post-traumatic stress disorder, unspecified: Secondary | ICD-10-CM | POA: Diagnosis not present
